# Patient Record
Sex: Female | Born: 1995 | Hispanic: No | Marital: Married | Smoking: Never smoker
Health system: Southern US, Community
[De-identification: ages and names within clinical notes are randomized; demographics above are authoritative.]

## PROBLEM LIST (undated history)

## (undated) ENCOUNTER — Inpatient Hospital Stay (HOSPITAL_COMMUNITY): Payer: Self-pay

## (undated) DIAGNOSIS — Z789 Other specified health status: Secondary | ICD-10-CM

## (undated) HISTORY — PX: NO PAST SURGERIES: SHX2092

---

## 2015-05-13 ENCOUNTER — Encounter (HOSPITAL_COMMUNITY): Payer: Self-pay | Admitting: *Deleted

## 2015-05-13 ENCOUNTER — Emergency Department (HOSPITAL_COMMUNITY)
Admission: EM | Admit: 2015-05-13 | Discharge: 2015-05-13 | Disposition: A | Discharge status: Home / Self Care | Payer: Medicaid Other | Source: Home / Self Care | Attending: Family Medicine | Admitting: Family Medicine

## 2015-05-13 DIAGNOSIS — L250 Unspecified contact dermatitis due to cosmetics: Secondary | ICD-10-CM | POA: Diagnosis not present

## 2015-05-13 MED ORDER — MOMETASONE FUROATE 0.1 % EX CREA: 1.0000 "application " | TOPICAL_CREAM | Freq: Every day | CUTANEOUS | Status: DC

## 2015-05-13 NOTE — ED Notes (Signed)
Pt  Has  A  Rash   With     Dry  Sky       Symptoms  X  5  Days     Pt    Reports     No       New  Medications         No known            Causative  Agents

## 2015-05-13 NOTE — ED Provider Notes (Addendum)
CSN: [id number]     Arrival date & time 05/13/15  1741 History   First MD Initiated Contact with Patient 05/13/15 1908     Chief Complaint  Patient presents with  . Rash   (Consider location/radiation/quality/duration/timing/severity/associated sxs/prior Treatment) Patient is a 20 y.o. female presenting with rash. The history is provided by the patient and the spouse.  Rash Location:  Face and head/neck Head/neck rash location:  R neck and L neck Facial rash location:  R cheek and L cheek Quality: itchiness and redness   Quality: not scaling and not swelling   Severity:  Mild Onset quality:  Gradual Duration:  5 days Progression:  Unchanged Chronicity:  New Relieved by:  None tried Worsened by:  Nothing tried Ineffective treatments:  None tried   History reviewed. No pertinent past medical history. History reviewed. No pertinent past surgical history. History reviewed. No pertinent family history. Social History  Substance Use Topics  . Smoking status: None  . Smokeless tobacco: None  . Alcohol Use: No   OB History    No data available     Review of Systems  Constitutional: Negative.   Skin: Positive for rash.  All other systems reviewed and are negative.   Allergies  Review of patient's allergies indicates not on file.  Home Medications   Prior to Admission medications   Medication Sig Start Date End Date Taking? Authorizing Provider  mometasone (ELOCON) 0.1 % cream Apply 1 application topically daily. At hs 05/13/15   Linna HoffJames D Kindl, MD   Meds Ordered and Administered this Visit  Medications - No data to display  BP 108/71 mmHg  Pulse 77  Temp(Src) 97.2 F (36.2 C) (Oral)  Resp 16  SpO2 97%  LMP 04/22/2015 No data found.   Physical Exam  Constitutional: She is oriented to person, place, and time. She appears well-developed and well-nourished. No distress.  HENT:  Mouth/Throat: Oropharynx is clear and moist.  Neck: Normal range of motion. Neck  supple.  Lymphadenopathy:    She has no cervical adenopathy.  Neurological: She is alert and oriented to person, place, and time.  Skin: Skin is warm and dry. Rash noted.  bilat alar pruritic erythema and ant bilat neck skin  Nursing note and vitals reviewed.   ED Course  Procedures (including critical care time)  Labs Review Labs Reviewed - No data to display  Imaging Review No results found.   Visual Acuity Review  Right Eye Distance:   Left Eye Distance:   Bilateral Distance:    Right Eye Near:   Left Eye Near:    Bilateral Near:         MDM   1. Contact dermatitis due to cosmetics    Meds ordered this encounter  Medications  . mometasone (ELOCON) 0.1 % cream    Sig: Apply 1 application topically daily. At hs    Dispense:  15 g    Refill:  1       Linna HoffJames D Kindl, MD 05/13/15 Ernestina [id number]  Linna HoffJames D Kindl, MD 05/13/15 Ernestina [id number]

## 2015-06-12 ENCOUNTER — Encounter (HOSPITAL_COMMUNITY): Payer: Self-pay | Admitting: Family Medicine

## 2015-06-12 ENCOUNTER — Emergency Department (HOSPITAL_COMMUNITY)
Admission: EM | Admit: 2015-06-12 | Discharge: 2015-06-12 | Disposition: A | Discharge status: Home / Self Care | Payer: Medicaid Other | Attending: Emergency Medicine | Admitting: Emergency Medicine

## 2015-06-12 DIAGNOSIS — Z3202 Encounter for pregnancy test, result negative: Secondary | ICD-10-CM | POA: Diagnosis not present

## 2015-06-12 DIAGNOSIS — R251 Tremor, unspecified: Secondary | ICD-10-CM | POA: Insufficient documentation

## 2015-06-12 DIAGNOSIS — B349 Viral infection, unspecified: Secondary | ICD-10-CM | POA: Insufficient documentation

## 2015-06-12 DIAGNOSIS — Z7952 Long term (current) use of systemic steroids: Secondary | ICD-10-CM | POA: Insufficient documentation

## 2015-06-12 DIAGNOSIS — R51 Headache: Secondary | ICD-10-CM | POA: Diagnosis present

## 2015-06-12 DIAGNOSIS — R52 Pain, unspecified: Secondary | ICD-10-CM

## 2015-06-12 LAB — URINE MICROSCOPIC-ADD ON

## 2015-06-12 LAB — URINALYSIS, ROUTINE W REFLEX MICROSCOPIC
BILIRUBIN URINE: NEGATIVE
GLUCOSE, UA: NEGATIVE mg/dL
Hgb urine dipstick: NEGATIVE
KETONES UR: NEGATIVE mg/dL
Nitrite: NEGATIVE
PH: 7 (ref 5.0–8.0)
Protein, ur: NEGATIVE mg/dL
Specific Gravity, Urine: 1.021 (ref 1.005–1.030)

## 2015-06-12 LAB — POC URINE PREG, ED: Preg Test, Ur: NEGATIVE

## 2015-06-12 MED ORDER — ACETAMINOPHEN 325 MG PO TABS: 650.0000 mg | ORAL_TABLET | Freq: Four times a day (QID) | ORAL | Status: DC | PRN

## 2015-06-12 MED ORDER — ACETAMINOPHEN 325 MG PO TABS
650.0000 mg | ORAL_TABLET | Freq: Once | ORAL | Status: AC
  Administered 2015-06-12: 650 mg via ORAL
  Filled 2015-06-12: qty 2

## 2015-06-12 MED ORDER — SODIUM CHLORIDE 0.9 % IV BOLUS (SEPSIS): 1000.0000 mL | Freq: Once | INTRAVENOUS | Status: DC

## 2015-06-12 MED ORDER — KETOROLAC TROMETHAMINE 30 MG/ML IJ SOLN
30.0000 mg | Freq: Once | INTRAMUSCULAR | Status: DC
  Filled 2015-06-12: qty 1

## 2015-06-12 NOTE — ED Notes (Signed)
Iv attempted x 2 without success. 

## 2015-06-12 NOTE — ED Notes (Signed)
Pt here for body aches, headache, and shaking. Denies N,V,D.

## 2015-06-12 NOTE — ED Provider Notes (Signed)
History  By signing my name below, I, Karle Plumber, attest that this documentation has been prepared under the direction and in the presence of Tyara Dassow, [place]. Electronically Signed: Karle Plumber, ED Scribe. 06/12/2015. 1:17 PM  Chief Complaint  Patient presents with  . Generalized Body Aches  . Shaking  . Headache   The history is provided by the patient and medical records. No language interpreter was used.    HPI Comments:  Elizabeth Summers is a 20 y.o. female who presents to the Emergency Department complaining of generalized body aches that began last night. She reports HA and states she feels "shaky inside her body". She has not taken anything for her symptoms. She denies modifying factors. She denies nausea, vomiting, abdominal pain, LOC, dizziness, light-headedness, fever, chills, cough, congestion.   History reviewed. No pertinent past medical history. History reviewed. No pertinent past surgical history. History reviewed. No pertinent family history. Social History  Substance Use Topics  . Smoking status: Never Smoker   . Smokeless tobacco: None  . Alcohol Use: No   OB History    No data available     Review of Systems A complete 10 system review of systems was obtained and all systems are negative except as noted in the HPI and PMH.   Allergies  Review of patient's allergies indicates no known allergies.  Home Medications   Prior to Admission medications   Medication Sig Start Date End Date Taking? Authorizing Provider  mometasone (ELOCON) 0.1 % cream Apply 1 application topically daily. At hs 05/13/15   Linna Hoff, MD   Triage Vitals: BP 112/62 mmHg  Pulse 76  Temp(Src) 97.5 F (36.4 C) (Oral)  Resp 14  SpO2 98%  LMP 04/22/2015 Physical Exam  Constitutional: She is oriented to person, place, and time. She appears well-developed and well-nourished.  HENT:  Head: Normocephalic and atraumatic.  Right Ear: External ear normal.  Left Ear:  External ear normal.  MM dry  Eyes: Conjunctivae and EOM are normal. Pupils are equal, round, and reactive to light.  Neck: Normal range of motion.  Cardiovascular: Normal rate, regular rhythm and normal heart sounds.  Exam reveals no gallop and no friction rub.   No murmur heard. Pulmonary/Chest: Effort normal and breath sounds normal. No respiratory distress. She has no wheezes. She has no rales.  Abdominal: There is no CVA tenderness.  Musculoskeletal: Normal range of motion.  No C, T or L spine tenderness.  Neurological: She is alert and oriented to person, place, and time. No cranial nerve deficit. Coordination normal.  Skin: Skin is warm and dry.  Psychiatric: She has a normal mood and affect. Her behavior is normal.  Nursing note and vitals reviewed.   ED Course  Procedures (including critical care time) DIAGNOSTIC STUDIES: Oxygen Saturation is 98% on RA, normal by my interpretation.   COORDINATION OF CARE: 12:33 PM- Informed pt of normal labs. Will order IV fluids, Toradol and Tylenol. Pt verbalizes understanding and agrees to plan.  Medications  acetaminophen (TYLENOL) tablet 650 mg (650 mg Oral Given 06/12/15 1354)    Labs Review Labs Reviewed  URINALYSIS, ROUTINE W REFLEX MICROSCOPIC (NOT AT [facility]) - Abnormal; Notable for the following:    APPearance HAZY (*)    Leukocytes, UA MODERATE (*)    All other components within normal limits  URINE MICROSCOPIC-ADD ON - Abnormal; Notable for the following:    Squamous Epithelial / LPF 6-30 (*)    Bacteria, UA FEW (*)  All other components within normal limits  POC URINE PREG, ED    Imaging Review No results found. I have personally reviewed and evaluated these images and lab results as part of my medical decision-making.   EKG Interpretation None      MDM   Final diagnoses:  Viral syndrome  Body aches    Suspect viral syndrome with diffuse body aches and dull headache. Neuro exam intact. Rest of exam  nonfocal. Hemodynamically stable. Pt declines fluids and toradol after unsuccessful IV attempts. Tylenol given in the ED with rx for same. Resource guide given to establish PCP for f/u. ER return precautions given.   I personally performed the services described in this documentation, which was scribed in my presence. The recorded information has been reviewed and is accurate.    Carlene CoriaSerena Y Draycen Leichter, PA-C 06/12/15 1405  Lavera Guiseana Duo Liu, MD 06/12/15 [phone]

## 2015-06-12 NOTE — ED Notes (Signed)
Pt verbalizes understanding of instructions. 

## 2015-06-12 NOTE — Discharge Instructions (Signed)
Take medications as prescribed. Return to the emergency room for worsening condition or new concerning symptoms. Follow up with your regular doctor. If you don't have a regular doctor use one of the numbers below to establish a primary care doctor. ° ° °Emergency Department Resource Guide °1) Find a Doctor and Pay Out of Pocket °Although you won't have to find out who is covered by your insurance plan, it is a good idea to ask around and get recommendations. You will then need to call the office and see if the doctor you have chosen will accept you as a new patient and what types of options they offer for patients who are self-pay. Some doctors offer discounts or will set up payment plans for their patients who do not have insurance, but you will need to ask so you aren't surprised when you get to your appointment. ° °2) Contact Your Local Health Department °Not all health departments have doctors that can see patients for sick visits, but many do, so it is worth a call to see if yours does. If you don't know where your local health department is, you can check in your phone book. The CDC also has a tool to help you locate your state's health department, and many state websites also have listings of all of their local health departments. ° °3) Find a Walk-in Clinic °If your illness is not likely to be very severe or complicated, you may want to try a walk in clinic. These are popping up all over the country in pharmacies, drugstores, and shopping centers. They're usually staffed by nurse practitioners or physician assistants that have been trained to treat common illnesses and complaints. They're usually fairly quick and inexpensive. However, if you have serious medical issues or chronic medical problems, these are probably not your best option. ° °No Primary Care Doctor: °- Call [facility] at  832-8000 - they can help you locate a primary care doctor that  accepts your insurance, provides certain services,  etc. °- Physician Referral Service- 1-800-533-3463 ° °Emergency Department Resource Guide °1) Find a Doctor and Pay Out of Pocket °Although you won't have to find out who is covered by your insurance plan, it is a good idea to ask around and get recommendations. You will then need to call the office and see if the doctor you have chosen will accept you as a new patient and what types of options they offer for patients who are self-pay. Some doctors offer discounts or will set up payment plans for their patients who do not have insurance, but you will need to ask so you aren't surprised when you get to your appointment. ° °2) Contact Your Local Health Department °Not all health departments have doctors that can see patients for sick visits, but many do, so it is worth a call to see if yours does. If you don't know where your local health department is, you can check in your phone book. The CDC also has a tool to help you locate your state's health department, and many state websites also have listings of all of their local health departments. ° °3) Find a Walk-in Clinic °If your illness is not likely to be very severe or complicated, you may want to try a walk in clinic. These are popping up all over the country in pharmacies, drugstores, and shopping centers. They're usually staffed by nurse practitioners or physician assistants that have been trained to treat common illnesses and complaints. They're usually fairly   quick and inexpensive. However, if you have serious medical issues or chronic medical problems, these are probably not your best option. ° °No Primary Care Doctor: °- Call [facility] Connect at  832-8000 - they can help you locate a primary care doctor that  accepts your insurance, provides certain services, etc. °- Physician Referral Service- 1-800-533-3463 ° °Chronic Pain Problems: °Organization         Address  Phone   Notes  ° [facility]  ([phone]) [phone] Patients need to be referred by  their primary care doctor.  ° °Medication Assistance: °Organization         Address  Phone   Notes  °[place] [facility] [address] °[place], [place] [address] [phone]) [phone] --Must be a resident of [place] °-- Must have NO insurance coverage whatsoever (no Medicaid/ Medicare, etc.) °-- The pt. MUST have a primary care doctor that directs their care regularly and follows them in the community °  °[facility]  (866) 331-1348   °[employer]  (888) 892-1162   ° °Agencies that provide inexpensive medical care: °Organization         Address  Phone   Notes  °[facility]  ([phone]) [phone]   °[facility]    (336) 832-7272   °[facility] [address] °[place], [place] [address] (336) [phone]   °[facility] [address], °[place] ([phone]) [phone]   °[facility]    (336) 373-0678   °[facility]    (336) 272-1050   °[facility] ° [address], [place] Phone:  (336) [phone], Fax:  (336) [phone] Hours of Operation:  9 am - 6 pm, M-F.  Also accepts Medicaid/Medicare and self-pay.  °[facility] ° [address], [place] Phone: (336) [phone], Fax: (336) [phone]. Hours of Operation:  8:30 am - 5:30 pm, M-F.  Also accepts Medicaid and self-pay.  °[facility] [place] [address], [place] Phone: (336) [phone]   °[facility] [address], [place], [place] (336)[phone], Ext. 123 Mondays & Thursdays: 7-9 AM.  First 15 patients are seen on a first come, first serve basis. °  ° °Medicaid-accepting [place] Providers: ° °Organization         Address  Phone   Notes  °[facility] [address], [place] ([phone]) [phone] Also accepts self-pay patients.  °[facility] [address], [place] ° ([phone]) [phone]   °[facility]  [address], [place] ([phone]) [phone]   °[facility] [address] [place] [address], [place] ([phone]) [phone]   °[facility] [address], [place]  ° ([phone]) [phone] Only accepts [place] Access Medicaid patients after they have their name applied to their card.  ° °Self-Pay (no insurance) in [place]: ° °Organization         Address  Phone   Notes  °Sickle Cell Patients, [facility] [address], [place] ([phone]) [phone]   °[facility] [address], [place] ([phone]) [phone]   °[facility] ° [address], [place] ([phone]) [phone]   °[facility]/Dr. Osei-Bonsu ° [address] [place] [address], [place] or [address], [place] [phone]) [phone] Phone number   for both [place] and [place] locations is the same.  °[facility] [address], [place] ([phone]) [phone]   °[facility] [address] [place] [address], [place] or [address] ([phone]) [phone] °(336) [phone]   °[facility] [address], [place] (336) [phone], phone; ([phone]) [phone], fax Sees patients 1st and 3rd Saturday of every month.  Must not qualify for public or private insurance (i.e. Medicaid, Medicare, [place] Health Choice, Veterans' Benefits) • Household income should be no more than 200% of the poverty level •The clinic cannot treat you if you are pregnant or think you are pregnant • Sexually transmitted diseases are not treated at the clinic.  ° ° ° °

## 2016-07-31 ENCOUNTER — Ambulatory Visit (HOSPITAL_COMMUNITY)
Admission: EM | Admit: 2016-07-31 | Discharge: 2016-07-31 | Disposition: A | Discharge status: Home / Self Care | Payer: Medicaid Other | Attending: Family Medicine | Admitting: Family Medicine

## 2016-07-31 ENCOUNTER — Encounter (HOSPITAL_COMMUNITY): Payer: Self-pay | Admitting: Emergency Medicine

## 2016-07-31 DIAGNOSIS — K0889 Other specified disorders of teeth and supporting structures: Secondary | ICD-10-CM

## 2016-07-31 MED ORDER — HYDROCODONE-ACETAMINOPHEN 5-325 MG PO TABS: 1.0000 | ORAL_TABLET | Freq: Four times a day (QID) | ORAL | 0 refills | Status: DC | PRN

## 2016-07-31 MED ORDER — PENICILLIN V POTASSIUM 500 MG PO TABS: 500.0000 mg | ORAL_TABLET | Freq: Three times a day (TID) | ORAL | 0 refills | Status: DC

## 2016-07-31 NOTE — Discharge Instructions (Signed)
You will need to get dental care.  Try one of the phone numbers we give you on Monday when the offices are open.

## 2016-07-31 NOTE — ED Provider Notes (Signed)
[facility]    CSN: [id number] Arrival date & time: 07/31/16  1742     History   Chief Complaint Chief Complaint  Patient presents with  . Dental Pain    HPI Elizabeth Summers is a 21 y.o. female.   This is a 21 year old woman with dental pain for a week on the lower right side of the jaw.  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      History reviewed. No pertinent past medical history.  There are no active problems to display for this patient.   History reviewed. No pertinent surgical history.  OB History    No data available       Home Medications    Prior to Admission medications   Medication Sig Start Date End Date Taking? Authorizing Provider  acetaminophen (TYLENOL) 325 MG tablet Take 2 tablets (650 mg total) by mouth every 6 (six) hours as needed. 06/12/15   Sam, Ace GinsSerena Y, PA-C  HYDROcodone-acetaminophen (NORCO) 5-325 MG tablet Take 1 tablet by mouth every 6 (six) hours as needed for moderate pain. 07/31/16   Elvina SidleLauenstein, Kalenna Millett, MD  mometasone (ELOCON) 0.1 % cream Apply 1 application topically daily. At hs 05/13/15   Linna HoffKindl, James D, MD  penicillin v potassium (VEETID) 500 MG tablet Take 1 tablet (500 mg total) by mouth 3 (three) times daily. 07/31/16   Elvina SidleLauenstein, Maddyn Lieurance, MD    Family History History reviewed. No pertinent family history.  Social History Social History  Substance Use Topics  . Smoking status: Never Smoker  . Smokeless tobacco: Never Used  . Alcohol use No     Allergies   Patient has no known allergies.   Review of Systems Review of Systems  HENT: Positive for dental problem.   All other systems reviewed and are negative.    Physical Exam Triage Vital Signs ED Triage Vitals  Enc Vitals Group     BP      Pulse      Resp      Temp      Temp src      SpO2      Weight      Height      Head Circumference      Peak Flow      Pain Score      Pain Loc      Pain Edu?      Excl. in GC?    No  data found.   Updated Vital Signs BP 101/63 (BP Location: Right Arm)   Pulse 66   Temp 98.2 F (36.8 C) (Oral)   Resp 18   LMP 07/27/2016   SpO2 100%    Physical Exam  Constitutional: She is oriented to person, place, and time. She appears well-developed and well-nourished.  HENT:  Right Ear: External ear normal.  Left Ear: External ear normal.  Tooth #32 is erupting with some gum swelling  Eyes: Conjunctivae are normal.  Neck: Normal range of motion. Neck supple.  Pulmonary/Chest: Effort normal.  Musculoskeletal: Normal range of motion.  Neurological: She is alert and oriented to person, place, and time.  Skin: Skin is warm and dry.  Nursing note and vitals reviewed.    UC Treatments / Results  Labs (all labs ordered are listed, but only abnormal results are displayed) Labs Reviewed - No data to display  EKG  EKG Interpretation None       Radiology No results found.  Procedures  Procedures (including critical care time)  Medications Ordered in UC Medications - No data to display   Initial Impression / Assessment and Plan / UC Course  I have reviewed the triage vital signs and the nursing notes.  Pertinent labs & imaging results that were available during my care of the patient were reviewed by me and considered in my medical decision making (see chart for details).     Final Clinical Impressions(s) / UC Diagnoses   Final diagnoses:  Pain, dental    New Prescriptions New Prescriptions   HYDROCODONE-ACETAMINOPHEN (NORCO) 5-325 MG TABLET    Take 1 tablet by mouth every 6 (six) hours as needed for moderate pain.   PENICILLIN V POTASSIUM (VEETID) 500 MG TABLET    Take 1 tablet (500 mg total) by mouth 3 (three) times daily.     Elvina Sidle, MD 07/31/16 [phone]

## 2016-07-31 NOTE — ED Triage Notes (Signed)
Pt c/o right lower dental pain onset 1 week  Denies fevers, chills  A&O x4... NAD.... Ambulatory

## 2017-02-17 LAB — OB RESULTS CONSOLE GBS: GBS: NEGATIVE

## 2017-02-17 LAB — OB RESULTS CONSOLE GC/CHLAMYDIA
Chlamydia: NEGATIVE
Gonorrhea: NEGATIVE

## 2017-09-16 ENCOUNTER — Other Ambulatory Visit (HOSPITAL_COMMUNITY): Payer: Self-pay | Admitting: Nurse Practitioner

## 2017-09-16 DIAGNOSIS — Z3682 Encounter for antenatal screening for nuchal translucency: Secondary | ICD-10-CM

## 2017-09-16 DIAGNOSIS — Z3A13 13 weeks gestation of pregnancy: Secondary | ICD-10-CM

## 2017-09-16 LAB — OB RESULTS CONSOLE ABO/RH: RH Type: POSITIVE

## 2017-09-16 LAB — OB RESULTS CONSOLE RPR: RPR: NONREACTIVE

## 2017-09-16 LAB — OB RESULTS CONSOLE ANTIBODY SCREEN: Antibody Screen: NEGATIVE

## 2017-09-16 LAB — OB RESULTS CONSOLE HEPATITIS B SURFACE ANTIGEN: Hepatitis B Surface Ag: NEGATIVE

## 2017-09-16 LAB — OB RESULTS CONSOLE RUBELLA ANTIBODY, IGM: Rubella: IMMUNE

## 2017-09-16 LAB — OB RESULTS CONSOLE HIV ANTIBODY (ROUTINE TESTING): HIV: NONREACTIVE

## 2017-09-16 LAB — OB RESULTS CONSOLE GC/CHLAMYDIA
Chlamydia: NEGATIVE
Gonorrhea: NEGATIVE

## 2017-09-20 ENCOUNTER — Encounter (HOSPITAL_COMMUNITY): Payer: Self-pay | Admitting: *Deleted

## 2017-09-22 ENCOUNTER — Ambulatory Visit (HOSPITAL_COMMUNITY)
Admission: RE | Admit: 2017-09-22 | Discharge: 2017-09-22 | Disposition: A | Discharge status: Home / Self Care | Payer: Medicaid Other | Source: Ambulatory Visit | Attending: Nurse Practitioner | Admitting: Nurse Practitioner

## 2017-09-22 ENCOUNTER — Ambulatory Visit (HOSPITAL_COMMUNITY): Admission: RE | Admit: 2017-09-22 | Payer: Medicaid Other | Source: Ambulatory Visit

## 2017-09-22 ENCOUNTER — Other Ambulatory Visit (HOSPITAL_COMMUNITY): Payer: Self-pay | Admitting: *Deleted

## 2017-09-22 ENCOUNTER — Other Ambulatory Visit (HOSPITAL_COMMUNITY): Payer: Self-pay | Admitting: Nurse Practitioner

## 2017-09-22 ENCOUNTER — Encounter (HOSPITAL_COMMUNITY): Payer: Self-pay

## 2017-09-22 DIAGNOSIS — Z3A15 15 weeks gestation of pregnancy: Secondary | ICD-10-CM

## 2017-09-22 DIAGNOSIS — Z363 Encounter for antenatal screening for malformations: Secondary | ICD-10-CM

## 2017-09-22 DIAGNOSIS — Z3A13 13 weeks gestation of pregnancy: Secondary | ICD-10-CM

## 2017-09-22 DIAGNOSIS — Z3682 Encounter for antenatal screening for nuchal translucency: Secondary | ICD-10-CM

## 2017-09-22 DIAGNOSIS — Z362 Encounter for other antenatal screening follow-up: Secondary | ICD-10-CM

## 2017-09-22 HISTORY — DX: Other specified health status: Z78.9

## 2017-10-01 ENCOUNTER — Inpatient Hospital Stay (HOSPITAL_COMMUNITY)
Admission: AD | Admit: 2017-10-01 | Discharge: 2017-10-01 | Disposition: A | Discharge status: Home / Self Care | Payer: Medicaid Other | Source: Ambulatory Visit | Attending: Obstetrics and Gynecology | Admitting: Obstetrics and Gynecology

## 2017-10-01 ENCOUNTER — Encounter (HOSPITAL_COMMUNITY): Payer: Self-pay

## 2017-10-01 DIAGNOSIS — O9989 Other specified diseases and conditions complicating pregnancy, childbirth and the puerperium: Secondary | ICD-10-CM | POA: Diagnosis not present

## 2017-10-01 DIAGNOSIS — Z3A16 16 weeks gestation of pregnancy: Secondary | ICD-10-CM | POA: Diagnosis not present

## 2017-10-01 DIAGNOSIS — M545 Low back pain: Secondary | ICD-10-CM | POA: Diagnosis not present

## 2017-10-01 DIAGNOSIS — O26892 Other specified pregnancy related conditions, second trimester: Secondary | ICD-10-CM | POA: Diagnosis present

## 2017-10-01 DIAGNOSIS — Z79899 Other long term (current) drug therapy: Secondary | ICD-10-CM | POA: Diagnosis not present

## 2017-10-01 DIAGNOSIS — M549 Dorsalgia, unspecified: Secondary | ICD-10-CM | POA: Diagnosis not present

## 2017-10-01 DIAGNOSIS — O99891 Other specified diseases and conditions complicating pregnancy: Secondary | ICD-10-CM

## 2017-10-01 LAB — URINALYSIS, ROUTINE W REFLEX MICROSCOPIC
BILIRUBIN URINE: NEGATIVE
Bacteria, UA: NONE SEEN
GLUCOSE, UA: NEGATIVE mg/dL
Hgb urine dipstick: NEGATIVE
KETONES UR: NEGATIVE mg/dL
NITRITE: NEGATIVE
PH: 7 (ref 5.0–8.0)
Protein, ur: NEGATIVE mg/dL
Specific Gravity, Urine: 1.008 (ref 1.005–1.030)

## 2017-10-01 MED ORDER — CYCLOBENZAPRINE HCL 10 MG PO TABS
10.0000 mg | ORAL_TABLET | Freq: Once | ORAL | Status: AC
  Administered 2017-10-01: 10 mg via ORAL
  Filled 2017-10-01: qty 1

## 2017-10-01 NOTE — Discharge Instructions (Signed)

## 2017-10-01 NOTE — MAU Provider Note (Signed)
History     CSN: [id number]  Arrival date and time: 10/01/17 1629  Provider first contact with patient at 1725     Chief Complaint  Patient presents with  . Back Pain   HPI  Ms.  Elizabeth Summers is a 22 y.o. year old [id number] female at 1318w4d weeks gestation who presents to MAU reporting lower back pain since yesterday. She denies any heavy lifting or strenuous activity over the past 24-48 hrs. She has not taken anything for the pain. She denies abdominal/pelvic pain/cramping, VB or LOF.  Past Medical History:  Diagnosis Date  . Medical history non-contributory     Past Surgical History:  Procedure Laterality Date  . NO PAST SURGERIES      No family history on file.  Social History   Tobacco Use  . Smoking status: Never Smoker  . Smokeless tobacco: Never Used  Substance Use Topics  . Alcohol use: No  . Drug use: No    Allergies: No Known Allergies  Medications Prior to Admission  Medication Sig Dispense Refill Last Dose  . acetaminophen (TYLENOL) 325 MG tablet Take 2 tablets (650 mg total) by mouth every 6 (six) hours as needed. (Patient not taking: Reported on 09/22/2017) 30 tablet 0 Not Taking  . HYDROcodone-acetaminophen (NORCO) 5-325 MG tablet Take 1 tablet by mouth every 6 (six) hours as needed for moderate pain. (Patient not taking: Reported on 09/22/2017) 12 tablet 0 Not Taking  . mometasone (ELOCON) 0.1 % cream Apply 1 application topically daily. At hs (Patient not taking: Reported on 09/22/2017) 15 g 1 Not Taking  . penicillin v potassium (VEETID) 500 MG tablet Take 1 tablet (500 mg total) by mouth 3 (three) times daily. (Patient not taking: Reported on 09/22/2017) 30 tablet 0 Not Taking  . Prenatal Vit-Fe Fumarate-FA (PRENATAL VITAMIN PO) Take by mouth.   Taking    Review of Systems  Constitutional: Negative.   HENT: Negative.   Eyes: Negative.   Respiratory: Negative.   Cardiovascular: Negative.   Gastrointestinal: Negative.   Endocrine: Negative.    Genitourinary: Negative.   Musculoskeletal: Positive for back pain (lower).  Skin: Negative.   Allergic/Immunologic: Negative.   Neurological: Negative.   Hematological: Negative.   Psychiatric/Behavioral: Negative.    Physical Exam   Blood pressure (!) 101/58, pulse 89, temperature 97.6 F (36.4 C), temperature source Oral, resp. rate 18, weight 60.3 kg, last menstrual period 06/18/2017.  Physical Exam  Nursing note and vitals reviewed. Constitutional: She is oriented to person, place, and time. She appears well-developed and well-nourished.  HENT:  Head: Normocephalic and atraumatic.  Eyes: Pupils are equal, round, and reactive to light.  Neck: Normal range of motion.  Cardiovascular: Normal rate.  Respiratory: Effort normal.  GI: Soft.  Genitourinary:  Genitourinary Comments: Pelvic deferred  Musculoskeletal: Normal range of motion.  Neurological: She is alert and oriented to person, place, and time.  Skin: Skin is warm and dry.  Psychiatric: She has a normal mood and affect. Her behavior is normal. Judgment and thought content normal.    MAU Course  Procedures  MDM CCUA Flexeril 10 mg po -- pain resolved "want to go home"  Results for orders placed or performed during the hospital encounter of 10/01/17 (from the past 24 hour(s))  Urinalysis, Routine w reflex microscopic     Status: Abnormal   Collection Time: 10/01/17  5:03 PM  Result Value Ref Range   Color, Urine YELLOW YELLOW   APPearance HAZY (A) CLEAR  Specific Gravity, Urine 1.008 1.005 - 1.030   pH 7.0 5.0 - 8.0   Glucose, UA NEGATIVE NEGATIVE mg/dL   Hgb urine dipstick NEGATIVE NEGATIVE   Bilirubin Urine NEGATIVE NEGATIVE   Ketones, ur NEGATIVE NEGATIVE mg/dL   Protein, ur NEGATIVE NEGATIVE mg/dL   Nitrite NEGATIVE NEGATIVE   Leukocytes, UA TRACE (A) NEGATIVE   RBC / HPF 0-5 0 - 5 RBC/hpf   WBC, UA 0-5 0 - 5 WBC/hpf   Bacteria, UA NONE SEEN NONE SEEN   Squamous Epithelial / LPF 6-10 0 - 5     Assessment and Plan  Back pain affecting pregnancy in second trimester - Plan: Discharge patient - Ok to take Tylenol 1000 mg every 6 hrs prn pain - Safe meds in pregnancy list given - Discharge home - Keep scheduled appt at [facility] - Patient verbalized an understanding of the plan of care and agrees.   Raelyn Mora, MSN, CNM 10/01/2017, 5:27 PM

## 2017-10-01 NOTE — MAU Note (Signed)
Pt C/O back pain since yesterday, denies bleeding, [facility].

## 2017-10-27 ENCOUNTER — Inpatient Hospital Stay (HOSPITAL_COMMUNITY)
Admission: AD | Admit: 2017-10-27 | Discharge: 2017-10-27 | Disposition: A | Discharge status: Home / Self Care | Payer: Medicaid Other | Source: Ambulatory Visit | Attending: Obstetrics & Gynecology | Admitting: Obstetrics & Gynecology

## 2017-10-27 ENCOUNTER — Other Ambulatory Visit: Payer: Self-pay

## 2017-10-27 ENCOUNTER — Encounter (HOSPITAL_COMMUNITY): Payer: Self-pay

## 2017-10-27 DIAGNOSIS — R102 Pelvic and perineal pain: Secondary | ICD-10-CM

## 2017-10-27 DIAGNOSIS — R1030 Lower abdominal pain, unspecified: Secondary | ICD-10-CM | POA: Diagnosis present

## 2017-10-27 DIAGNOSIS — Z3A20 20 weeks gestation of pregnancy: Secondary | ICD-10-CM | POA: Diagnosis not present

## 2017-10-27 DIAGNOSIS — O99612 Diseases of the digestive system complicating pregnancy, second trimester: Secondary | ICD-10-CM | POA: Insufficient documentation

## 2017-10-27 DIAGNOSIS — O26892 Other specified pregnancy related conditions, second trimester: Secondary | ICD-10-CM | POA: Diagnosis present

## 2017-10-27 DIAGNOSIS — O26899 Other specified pregnancy related conditions, unspecified trimester: Secondary | ICD-10-CM

## 2017-10-27 DIAGNOSIS — K219 Gastro-esophageal reflux disease without esophagitis: Secondary | ICD-10-CM | POA: Diagnosis not present

## 2017-10-27 LAB — CBC
HCT: 30.2 % — ABNORMAL LOW (ref 36.0–46.0)
HEMOGLOBIN: 10 g/dL — AB (ref 12.0–15.0)
MCH: 26.7 pg (ref 26.0–34.0)
MCHC: 33.1 g/dL (ref 30.0–36.0)
MCV: 80.7 fL (ref 78.0–100.0)
Platelets: 241 10*3/uL (ref 150–400)
RBC: 3.74 MIL/uL — AB (ref 3.87–5.11)
RDW: 13.5 % (ref 11.5–15.5)
WBC: 8.7 10*3/uL (ref 4.0–10.5)

## 2017-10-27 LAB — LIPASE, BLOOD: LIPASE: 37 U/L (ref 11–51)

## 2017-10-27 LAB — COMPREHENSIVE METABOLIC PANEL
ALK PHOS: 66 U/L (ref 38–126)
ALT: 15 U/L (ref 0–44)
ANION GAP: 9 (ref 5–15)
AST: 20 U/L (ref 15–41)
Albumin: 3.2 g/dL — ABNORMAL LOW (ref 3.5–5.0)
BUN: 6 mg/dL (ref 6–20)
CALCIUM: 8.9 mg/dL (ref 8.9–10.3)
CO2: 21 mmol/L — ABNORMAL LOW (ref 22–32)
Chloride: 106 mmol/L (ref 98–111)
Creatinine, Ser: 0.55 mg/dL (ref 0.44–1.00)
GFR calc non Af Amer: >60 mL/min (ref >60)
GLUCOSE: 93 mg/dL (ref 70–99)
POTASSIUM: 3.5 mmol/L (ref 3.5–5.1)
Sodium: 136 mmol/L (ref 135–145)
TOTAL PROTEIN: 6.8 g/dL (ref 6.5–8.1)

## 2017-10-27 LAB — URINALYSIS, ROUTINE W REFLEX MICROSCOPIC
BILIRUBIN URINE: NEGATIVE
GLUCOSE, UA: NEGATIVE mg/dL
HGB URINE DIPSTICK: NEGATIVE
Ketones, ur: NEGATIVE mg/dL
Nitrite: NEGATIVE
PH: 7 (ref 5.0–8.0)
Protein, ur: NEGATIVE mg/dL
Specific Gravity, Urine: 1.019 (ref 1.005–1.030)

## 2017-10-27 LAB — WET PREP, GENITAL
Clue Cells Wet Prep HPF POC: NONE SEEN
Sperm: NONE SEEN
Trich, Wet Prep: NONE SEEN
Yeast Wet Prep HPF POC: NONE SEEN

## 2017-10-27 MED ORDER — GI COCKTAIL ~~LOC~~
30.0000 mL | Freq: Once | ORAL | Status: AC
  Administered 2017-10-27: 30 mL via ORAL
  Filled 2017-10-27: qty 30

## 2017-10-27 MED ORDER — RANITIDINE HCL 150 MG PO CAPS: 150.0000 mg | ORAL_CAPSULE | Freq: Every evening | ORAL | 1 refills | Status: DC

## 2017-10-27 NOTE — MAU Provider Note (Addendum)
History     CSN: [id number]  Arrival date and time: 10/27/17 1525   First Provider Initiated Contact with Patient 10/27/17 1619      Chief Complaint  Patient presents with  . Abdominal Summers   Elizabeth Summers. Summers started 2 days ago. Summers is located bilateral upper and lower quadrants. Upper abdominal Summers is intermittent. Tolerating po. Reports daily burning in her upper chest. No alleviating or agrevating factors. Lower abd Summers only occurs with standing and walking. No GI or GU sx. No VB or discharge. No fevers. She has not tried anything for the Summers. [facility] and reports uncomplicated pregnancy.   OB History    Gravida  3   Para  1   Term  1   Preterm      AB  1   Living  1     SAB  1   TAB      Ectopic      Multiple      Live Births              Past Medical History:  Diagnosis Date  . Medical history non-contributory     Past Surgical History:  Procedure Laterality Date  . NO PAST SURGERIES      No family history on file.  Social History   Tobacco Use  . Smoking status: Never Smoker  . Smokeless tobacco: Never Used  Substance Use Topics  . Alcohol use: No  . Drug use: No    Allergies: No Known Allergies  Medications Prior to Admission  Medication Sig Dispense Refill Last Dose  . acetaminophen (TYLENOL) 325 MG tablet Take 2 tablets (650 mg total) by mouth every 6 (six) hours as needed. (Patient not taking: Reported on 09/22/2017) 30 tablet 0 Not Taking  . mometasone (ELOCON) 0.1 % cream Apply 1 application topically daily. At hs (Patient not taking: Reported on 09/22/2017) 15 g 1 Not Taking  . penicillin v potassium (VEETID) 500 MG tablet Take 1 tablet (500 mg total) by mouth 3 (three) times daily. (Patient not taking: Reported on 09/22/2017) 30 tablet 0 Not Taking  . Prenatal Vit-Fe Fumarate-FA (PRENATAL VITAMIN PO) Take by mouth.   Taking    Review of Systems  Constitutional: Negative for  chills and fever.  Gastrointestinal: Positive for abdominal Summers. Negative for constipation, diarrhea, nausea and vomiting.  Genitourinary: Negative for dysuria, frequency, urgency, vaginal bleeding and vaginal discharge.   Physical Exam   Blood pressure 102/61, pulse 81, temperature 98.2 F (36.8 C), temperature source Oral, resp. rate 16, height 5\' 1"  (1.549 m), weight 61.7 kg, last menstrual period 06/18/2017, SpO2 100 %.  Physical Exam  Nursing note and vitals reviewed. Constitutional: She is oriented to person, place, and time. She appears well-developed and well-nourished. No distress.  HENT:  Head: Normocephalic and atraumatic.  Neck: Normal range of motion.  Cardiovascular: Normal rate.  Respiratory: Effort normal. No respiratory distress.  GI: Soft. She exhibits no distension and no mass. There is tenderness in the right upper quadrant, suprapubic area and left upper quadrant. There is no rebound and no guarding.  gravid  Genitourinary:  Genitourinary Comments: External: no lesions or erythema Vagina: rugated, pink, moist, scant mucous yellow discharge Cervix closed/long   Musculoskeletal: Normal range of motion.  Neurological: She is alert and oriented to person, place, and time.  Skin: Skin is warm and dry.  Psychiatric: She has a normal mood and  affect.  FHT 156  Results for orders placed or performed during the hospital encounter of 10/27/17 (from the past 24 hour(s))  Wet prep, genital     Status: Abnormal   Collection Time: 10/27/17  4:37 PM  Result Value Ref Range   Yeast Wet Prep HPF POC NONE SEEN NONE SEEN   Trich, Wet Prep NONE SEEN NONE SEEN   Clue Cells Wet Prep HPF POC NONE SEEN NONE SEEN   WBC, Wet Prep HPF POC MANY (A) NONE SEEN   Sperm NONE SEEN   Urinalysis, Routine w reflex microscopic     Status: Abnormal   Collection Time: 10/27/17  4:52 PM  Result Value Ref Range   Color, Urine YELLOW YELLOW   APPearance HAZY (A) CLEAR   Specific Gravity,  Urine 1.019 1.005 - 1.030   pH 7.0 5.0 - 8.0   Glucose, UA NEGATIVE NEGATIVE mg/dL   Hgb urine dipstick NEGATIVE NEGATIVE   Bilirubin Urine NEGATIVE NEGATIVE   Ketones, ur NEGATIVE NEGATIVE mg/dL   Protein, ur NEGATIVE NEGATIVE mg/dL   Nitrite NEGATIVE NEGATIVE   Leukocytes, UA TRACE (A) NEGATIVE   RBC / HPF 0-5 0 - 5 RBC/hpf   WBC, UA 6-10 0 - 5 WBC/hpf   Bacteria, UA RARE (A) NONE SEEN   Squamous Epithelial / LPF 11-20 0 - 5   Mucus PRESENT   CBC     Status: Abnormal   Collection Time: 10/27/17  4:54 PM  Result Value Ref Range   WBC 8.7 4.0 - 10.5 K/uL   RBC 3.74 (L) 3.87 - 5.11 MIL/uL   Hemoglobin 10.0 (L) 12.0 - 15.0 g/dL   HCT 16.1 (L) 09.6 - 04.5 %   MCV 80.7 78.0 - 100.0 fL   MCH 26.7 26.0 - 34.0 pg   MCHC 33.1 30.0 - 36.0 g/dL   RDW 40.9 81.1 - 91.4 %   Platelets 241 150 - 400 K/uL  Comprehensive metabolic panel     Status: Abnormal   Collection Time: 10/27/17  4:54 PM  Result Value Ref Range   Sodium 136 135 - 145 mmol/L   Potassium 3.5 3.5 - 5.1 mmol/L   Chloride 106 98 - 111 mmol/L   CO2 21 (L) 22 - 32 mmol/L   Glucose, Bld 93 70 - 99 mg/dL   BUN 6 6 - 20 mg/dL   Creatinine, Ser 7.82 0.44 - 1.00 mg/dL   Calcium 8.9 8.9 - 95.6 mg/dL   Total Protein 6.8 6.5 - 8.1 g/dL   Albumin 3.2 (L) 3.5 - 5.0 g/dL   AST 20 15 - 41 U/L   ALT 15 0 - 44 U/L   Alkaline Phosphatase 66 38 - 126 U/L   Total Bilirubin <0.1 (L) 0.3 - 1.2 mg/dL   GFR calc non Af Amer >60 >60 mL/min   GFR calc Af Amer >60 >60 mL/min   Anion gap 9 5 - 15  Lipase, blood     Status: None   Collection Time: 10/27/17  4:54 PM  Result Value Ref Range   Lipase 37 11 - 51 U/L   MAU Course  Procedures GI cocktail  MDM Labs ordered and reviewed. Upper abd Summers is improved, no longer having LAP. No evidence of UTI or PTL. Summers likely GERD and RL. Discussed management. Stable for discharge home.  Assessment and Plan   1. [redacted] weeks gestation of pregnancy   2. Gastroesophageal reflux disease without  esophagitis   3. Summers of round ligament  during pregnancy    Discharge home Follow up at [facility] as scheduled PTL precautions Rx Zantac  Allergies as of 10/27/2017   No Known Allergies     Medication List    STOP taking these medications   mometasone 0.1 % cream Commonly known as:  ELOCON   penicillin v potassium 500 MG tablet Commonly known as:  VEETID     TAKE these medications   acetaminophen 325 MG tablet Commonly known as:  TYLENOL Take 2 tablets (650 mg total) by mouth every 6 (six) hours as needed.   PRENATAL VITAMIN PO Take by mouth.   ranitidine 150 MG capsule Commonly known as:  ZANTAC Take 1 capsule (150 mg total) by mouth every evening.      Donette Larry, CNM 10/27/2017, 6:04 PM

## 2017-10-27 NOTE — MAU Note (Signed)
Pt presents to MAU with c/o lower abdominal pain that started 2 days ago but is worse today. Pt denies VB and LOF.

## 2017-10-27 NOTE — Discharge Instructions (Signed)
Food Choices for Gastroesophageal Reflux Disease, Adult When you have gastroesophageal reflux disease (GERD), the foods you eat and your eating habits are very important. Choosing the right foods can help ease your discomfort. What guidelines do I need to follow?  Choose fruits, vegetables, whole grains, and low-fat dairy products.  Choose low-fat meat, fish, and poultry.  Limit fats such as oils, salad dressings, butter, nuts, and avocado.  Keep a food diary. This helps you identify foods that cause symptoms.  Avoid foods that cause symptoms. These may be different for everyone.  Eat small meals often instead of 3 large meals a day.  Eat your meals slowly, in a place where you are relaxed.  Limit fried foods.  Cook foods using methods other than frying.  Avoid drinking alcohol.  Avoid drinking large amounts of liquids with your meals.  Avoid bending over or lying down until 2-3 hours after eating. What foods are not recommended? These are some foods and drinks that may make your symptoms worse: Vegetables Tomatoes. Tomato juice. Tomato and spaghetti sauce. Chili peppers. Onion and garlic. Horseradish. Fruits Oranges, grapefruit, and lemon (fruit and juice). Meats High-fat meats, fish, and poultry. This includes hot dogs, ribs, ham, sausage, salami, and bacon. Dairy Whole milk and chocolate milk. Sour cream. Cream. Butter. Ice cream. Cream cheese. Drinks Coffee and tea. Bubbly (carbonated) drinks or energy drinks. Condiments Hot sauce. Barbecue sauce. Sweets/Desserts Chocolate and cocoa. Donuts. Peppermint and spearmint. Fats and Oils High-fat foods. This includes French fries and potato chips. Other Vinegar. Strong spices. This includes black pepper, white pepper, red pepper, cayenne, curry powder, cloves, ginger, and chili powder. The items listed above may not be a complete list of foods and drinks to avoid. Contact your dietitian for more information. This  information is not intended to replace advice given to you by your health care provider. Make sure you discuss any questions you have with your health care provider. Document Released: 07/28/2011 Document Revised: 07/04/2015 Document Reviewed: 11/30/2012 Elsevier Interactive Patient Education  2017 Elsevier Inc.  Gastroesophageal Reflux Disease, Adult Normally, food travels down the esophagus and stays in the stomach to be digested. If a person has gastroesophageal reflux disease (GERD), food and stomach acid move back up into the esophagus. When this happens, the esophagus becomes sore and swollen (inflamed). Over time, GERD can make small holes (ulcers) in the lining of the esophagus. Follow these instructions at home: Diet  Follow a diet as told by your doctor. You may need to avoid foods and drinks such as: ? Coffee and tea (with or without caffeine). ? Drinks that contain alcohol. ? Energy drinks and sports drinks. ? Carbonated drinks or sodas. ? Chocolate and cocoa. ? Peppermint and mint flavorings. ? Garlic and onions. ? Horseradish. ? Spicy and acidic foods, such as peppers, chili powder, curry powder, vinegar, hot sauces, and BBQ sauce. ? Citrus fruit juices and citrus fruits, such as oranges, lemons, and limes. ? Tomato-based foods, such as red sauce, chili, salsa, and pizza with red sauce. ? Fried and fatty foods, such as donuts, french fries, potato chips, and high-fat dressings. ? High-fat meats, such as hot dogs, rib eye steak, sausage, ham, and bacon. ? High-fat dairy items, such as whole milk, butter, and cream cheese.  Eat small meals often. Avoid eating large meals.  Avoid drinking large amounts of liquid with your meals.  Avoid eating meals during the 2-3 hours before bedtime.  Avoid lying down right after you eat.  Do   not exercise right after you eat. General instructions  Pay attention to any changes in your symptoms.  Take over-the-counter and prescription  medicines only as told by your doctor. Do not take aspirin, ibuprofen, or other NSAIDs unless your doctor says it is okay.  Do not use any tobacco products, including cigarettes, chewing tobacco, and e-cigarettes. If you need help quitting, ask your doctor.  Wear loose clothes. Do not wear anything tight around your waist.  Raise (elevate) the head of your bed about 6 inches (15 cm).  Try to lower your stress. If you need help doing this, ask your doctor.  If you are overweight, lose an amount of weight that is healthy for you. Ask your doctor about a safe weight loss goal.  Keep all follow-up visits as told by your doctor. This is important. Contact a doctor if:  You have new symptoms.  You lose weight and you do not know why it is happening.  You have trouble swallowing, or it hurts to swallow.  You have wheezing or a cough that keeps happening.  Your symptoms do not get better with treatment.  You have a hoarse voice. Get help right away if:  You have pain in your arms, neck, jaw, teeth, or back.  You feel sweaty, dizzy, or light-headed.  You have chest pain or shortness of breath.  You throw up (vomit) and your throw up looks like blood or coffee grounds.  You pass out (faint).  Your poop (stool) is bloody or black.  You cannot swallow, drink, or eat. This information is not intended to replace advice given to you by your health care provider. Make sure you discuss any questions you have with your health care provider. Document Released: 07/15/2007 Document Revised: 07/04/2015 Document Reviewed: 05/23/2014 Elsevier Interactive Patient Education  2018 Elsevier Inc.  

## 2017-10-27 NOTE — MAU Note (Signed)
Urine in lab 

## 2017-10-28 LAB — GC/CHLAMYDIA PROBE AMP (~~LOC~~) NOT AT ARMC
CHLAMYDIA, DNA PROBE: NEGATIVE
NEISSERIA GONORRHEA: NEGATIVE

## 2017-10-29 ENCOUNTER — Ambulatory Visit (HOSPITAL_COMMUNITY): Payer: Medicaid Other

## 2017-10-29 ENCOUNTER — Encounter (HOSPITAL_COMMUNITY): Payer: Medicaid Other

## 2017-10-29 ENCOUNTER — Ambulatory Visit (HOSPITAL_COMMUNITY)
Admission: RE | Admit: 2017-10-29 | Discharge: 2017-10-29 | Disposition: A | Discharge status: Home / Self Care | Payer: Medicaid Other | Source: Ambulatory Visit | Attending: Nurse Practitioner | Admitting: Nurse Practitioner

## 2017-11-05 ENCOUNTER — Ambulatory Visit (HOSPITAL_COMMUNITY)
Admission: RE | Admit: 2017-11-05 | Discharge: 2017-11-05 | Disposition: A | Discharge status: Home / Self Care | Payer: Medicaid Other | Source: Ambulatory Visit | Attending: Obstetrics and Gynecology | Admitting: Obstetrics and Gynecology

## 2017-11-05 ENCOUNTER — Encounter (HOSPITAL_COMMUNITY): Payer: Self-pay

## 2017-11-05 DIAGNOSIS — Z3A21 21 weeks gestation of pregnancy: Secondary | ICD-10-CM | POA: Diagnosis not present

## 2017-11-05 DIAGNOSIS — Z362 Encounter for other antenatal screening follow-up: Secondary | ICD-10-CM | POA: Diagnosis not present

## 2017-11-05 DIAGNOSIS — O352XX0 Maternal care for (suspected) hereditary disease in fetus, not applicable or unspecified: Secondary | ICD-10-CM | POA: Diagnosis not present

## 2017-11-08 ENCOUNTER — Other Ambulatory Visit (HOSPITAL_COMMUNITY): Payer: Self-pay | Admitting: *Deleted

## 2017-11-08 DIAGNOSIS — Z362 Encounter for other antenatal screening follow-up: Secondary | ICD-10-CM

## 2017-12-03 ENCOUNTER — Ambulatory Visit (HOSPITAL_COMMUNITY)
Admission: RE | Admit: 2017-12-03 | Discharge: 2017-12-03 | Disposition: A | Discharge status: Home / Self Care | Payer: Medicaid Other | Source: Ambulatory Visit | Attending: Nurse Practitioner | Admitting: Nurse Practitioner

## 2017-12-03 ENCOUNTER — Encounter (HOSPITAL_COMMUNITY): Payer: Self-pay

## 2017-12-03 DIAGNOSIS — O352XX0 Maternal care for (suspected) hereditary disease in fetus, not applicable or unspecified: Secondary | ICD-10-CM | POA: Diagnosis not present

## 2017-12-03 DIAGNOSIS — Z362 Encounter for other antenatal screening follow-up: Secondary | ICD-10-CM | POA: Insufficient documentation

## 2017-12-03 DIAGNOSIS — O9989 Other specified diseases and conditions complicating pregnancy, childbirth and the puerperium: Secondary | ICD-10-CM

## 2017-12-03 DIAGNOSIS — M549 Dorsalgia, unspecified: Secondary | ICD-10-CM

## 2017-12-03 DIAGNOSIS — Z3A25 25 weeks gestation of pregnancy: Secondary | ICD-10-CM | POA: Insufficient documentation

## 2017-12-06 ENCOUNTER — Other Ambulatory Visit (HOSPITAL_COMMUNITY): Payer: Self-pay | Admitting: *Deleted

## 2017-12-06 DIAGNOSIS — Z8279 Family history of other congenital malformations, deformations and chromosomal abnormalities: Secondary | ICD-10-CM

## 2017-12-16 ENCOUNTER — Encounter (HOSPITAL_COMMUNITY): Payer: Self-pay

## 2017-12-16 DIAGNOSIS — O09292 Supervision of pregnancy with other poor reproductive or obstetric history, second trimester: Secondary | ICD-10-CM

## 2017-12-31 ENCOUNTER — Ambulatory Visit (HOSPITAL_COMMUNITY)
Admission: RE | Admit: 2017-12-31 | Discharge: 2017-12-31 | Disposition: A | Discharge status: Home / Self Care | Payer: Medicaid Other | Source: Ambulatory Visit | Attending: Nurse Practitioner | Admitting: Nurse Practitioner

## 2017-12-31 DIAGNOSIS — Z3689 Encounter for other specified antenatal screening: Secondary | ICD-10-CM | POA: Diagnosis not present

## 2017-12-31 DIAGNOSIS — O352XX0 Maternal care for (suspected) hereditary disease in fetus, not applicable or unspecified: Secondary | ICD-10-CM

## 2017-12-31 DIAGNOSIS — Z3A29 29 weeks gestation of pregnancy: Secondary | ICD-10-CM | POA: Insufficient documentation

## 2017-12-31 DIAGNOSIS — Z8279 Family history of other congenital malformations, deformations and chromosomal abnormalities: Secondary | ICD-10-CM

## 2018-01-31 ENCOUNTER — Inpatient Hospital Stay (HOSPITAL_COMMUNITY)
Admission: AD | Admit: 2018-01-31 | Discharge: 2018-01-31 | Disposition: A | Discharge status: Home / Self Care | Payer: Medicaid Other | Attending: Obstetrics & Gynecology | Admitting: Obstetrics & Gynecology

## 2018-01-31 ENCOUNTER — Encounter (HOSPITAL_COMMUNITY): Payer: Self-pay | Admitting: Advanced Practice Midwife

## 2018-01-31 DIAGNOSIS — O219 Vomiting of pregnancy, unspecified: Secondary | ICD-10-CM

## 2018-01-31 DIAGNOSIS — R112 Nausea with vomiting, unspecified: Secondary | ICD-10-CM | POA: Diagnosis present

## 2018-01-31 DIAGNOSIS — O26893 Other specified pregnancy related conditions, third trimester: Secondary | ICD-10-CM

## 2018-01-31 DIAGNOSIS — O212 Late vomiting of pregnancy: Secondary | ICD-10-CM | POA: Diagnosis not present

## 2018-01-31 DIAGNOSIS — Z3A34 34 weeks gestation of pregnancy: Secondary | ICD-10-CM | POA: Diagnosis not present

## 2018-01-31 DIAGNOSIS — Z3689 Encounter for other specified antenatal screening: Secondary | ICD-10-CM

## 2018-01-31 LAB — CBC WITH DIFFERENTIAL/PLATELET
Basophils Absolute: 0 10*3/uL (ref 0.0–0.1)
Basophils Relative: 0 %
Eosinophils Absolute: 0.2 10*3/uL (ref 0.0–0.5)
Eosinophils Relative: 1 %
HEMATOCRIT: 32.4 % — AB (ref 36.0–46.0)
Hemoglobin: 9.9 g/dL — ABNORMAL LOW (ref 12.0–15.0)
LYMPHS ABS: 1.3 10*3/uL (ref 0.7–4.0)
Lymphocytes Relative: 10 %
MCH: 25.3 pg — ABNORMAL LOW (ref 26.0–34.0)
MCHC: 30.6 g/dL (ref 30.0–36.0)
MCV: 82.7 fL (ref 80.0–100.0)
Monocytes Absolute: 0.3 10*3/uL (ref 0.1–1.0)
Monocytes Relative: 2 %
NEUTROS ABS: 11.7 10*3/uL — AB (ref 1.7–7.7)
Neutrophils Relative %: 87 %
Platelets: 248 10*3/uL (ref 150–400)
RBC: 3.92 MIL/uL (ref 3.87–5.11)
RDW: 15.5 % (ref 11.5–15.5)
WBC: 13.4 10*3/uL — ABNORMAL HIGH (ref 4.0–10.5)
nRBC: 0 % (ref 0.0–0.2)

## 2018-01-31 LAB — COMPREHENSIVE METABOLIC PANEL
ALT: 18 U/L (ref 0–44)
AST: 24 U/L (ref 15–41)
Albumin: 3.1 g/dL — ABNORMAL LOW (ref 3.5–5.0)
Alkaline Phosphatase: 97 U/L (ref 38–126)
Anion gap: 8 (ref 5–15)
BUN: 6 mg/dL (ref 6–20)
CALCIUM: 8.7 mg/dL — AB (ref 8.9–10.3)
CO2: 17 mmol/L — ABNORMAL LOW (ref 22–32)
CREATININE: 0.49 mg/dL (ref 0.44–1.00)
Chloride: 110 mmol/L (ref 98–111)
GFR calc Af Amer: >60 mL/min (ref >60)
GFR calc non Af Amer: >60 mL/min (ref >60)
Glucose, Bld: 84 mg/dL (ref 70–99)
Potassium: 4.1 mmol/L (ref 3.5–5.1)
Sodium: 135 mmol/L (ref 135–145)
TOTAL PROTEIN: 6.7 g/dL (ref 6.5–8.1)
Total Bilirubin: 0.2 mg/dL — ABNORMAL LOW (ref 0.3–1.2)

## 2018-01-31 LAB — AMYLASE: Amylase: 87 U/L (ref 28–100)

## 2018-01-31 LAB — URINALYSIS, ROUTINE W REFLEX MICROSCOPIC
Bilirubin Urine: NEGATIVE
GLUCOSE, UA: NEGATIVE mg/dL
Hgb urine dipstick: NEGATIVE
KETONES UR: NEGATIVE mg/dL
NITRITE: NEGATIVE
PH: 7 (ref 5.0–8.0)
Protein, ur: NEGATIVE mg/dL
SPECIFIC GRAVITY, URINE: 1.006 (ref 1.005–1.030)

## 2018-01-31 LAB — LIPASE, BLOOD: Lipase: 38 U/L (ref 11–51)

## 2018-01-31 MED ORDER — ACETAMINOPHEN 325 MG PO TABS
650.0000 mg | ORAL_TABLET | Freq: Once | ORAL | Status: AC
  Administered 2018-01-31: 650 mg via ORAL
  Filled 2018-01-31: qty 2

## 2018-01-31 MED ORDER — ALUM & MAG HYDROXIDE-SIMETH 200-200-20 MG/5ML PO SUSP
30.0000 mL | Freq: Once | ORAL | Status: AC
  Administered 2018-01-31: 30 mL via ORAL
  Filled 2018-01-31: qty 30

## 2018-01-31 MED ORDER — LACTATED RINGERS IV SOLN
Freq: Once | INTRAVENOUS | Status: AC
  Administered 2018-01-31: 11:00:00 via INTRAVENOUS

## 2018-01-31 MED ORDER — DOXYLAMINE-PYRIDOXINE ER 20-20 MG PO TBCR: 1.0000 | EXTENDED_RELEASE_TABLET | Freq: Two times a day (BID) | ORAL | 0 refills | Status: DC

## 2018-01-31 NOTE — MAU Provider Note (Signed)
History     CSN: [id number]  Arrival date and time: 01/31/18 [id number]   First Provider Initiated Contact with Patient 01/31/18 1018      Chief Complaint  Patient presents with  . Emesis  . Nausea   HPI Elizabeth Summers is a 22 y.o. G3P1011 at 2254w0d who presents to MAU with chief complaint of nausea and vomiting in pregnancy. This is a new problem, onset this morning. Patient states she has had two episodes of vomiting. She is tolerating liquids. She endorses a normal bowel movement this morning. Patient also complains of 6/10 pain across the top of her abdomen. This is a new problem, onset coincides with onset of vomiting this morning. Pain does not radiate. She denies aggravating or alleviating factors. Patient has not taken medication or tried other treatments. She denies vaginal bleeding, leaking of fluid, decreased fetal movement, fever, falls, or recent illness.    [facility]  OB History    Gravida  3   Para  1   Term  1   Preterm      AB  1   Living  1     SAB  1   TAB      Ectopic      Multiple      Live Births              Past Medical History:  Diagnosis Date  . Medical history non-contributory     Past Surgical History:  Procedure Laterality Date  . NO PAST SURGERIES      No family history on file.  Social History   Tobacco Use  . Smoking status: Never Smoker  . Smokeless tobacco: Never Used  Substance Use Topics  . Alcohol use: No  . Drug use: No    Allergies: No Known Allergies  Medications Prior to Admission  Medication Sig Dispense Refill Last Dose  . acetaminophen (TYLENOL) 325 MG tablet Take 2 tablets (650 mg total) by mouth every 6 (six) hours as needed. (Patient not taking: Reported on 09/22/2017) 30 tablet 0 Not Taking  . Prenatal Vit-Fe Fumarate-FA (PRENATAL VITAMIN PO) Take by mouth.   Taking  . ranitidine (ZANTAC) 150 MG capsule Take 1 capsule (150 mg total) by mouth every evening. (Patient not taking:  Reported on 11/05/2017) 30 capsule 1 Not Taking    Review of Systems  Constitutional: Negative for chills, fatigue and fever.  Gastrointestinal: Positive for abdominal pain, nausea and vomiting.  Genitourinary: Negative for vaginal bleeding.  Musculoskeletal: Negative for back pain.  Neurological: Negative for headaches.  All other systems reviewed and are negative.  Physical Exam   Blood pressure (!) 112/53, pulse (!) 109, temperature 97.9 F (36.6 C), temperature source Oral, resp. rate 18, weight 65.5 kg, last menstrual period 06/18/2017.  Physical Exam  Nursing note and vitals reviewed. Constitutional: She is oriented to person, place, and time. She appears well-developed and well-nourished.  Respiratory: Effort normal.  GI: Soft. She exhibits no distension. There is no abdominal tenderness. There is no rebound and no guarding.  Gravid  Genitourinary:    Genitourinary Comments: Cervix closed/thick/posterior   Neurological: She is alert and oriented to person, place, and time.  Skin: Skin is warm and dry.  Psychiatric: She has a normal mood and affect. Her behavior is normal. Judgment and thought content normal.    MAU Course/MDM   --History term SVD --Closed cervix --occasional mild contractions resolving with IV hydration and Tylenol --Patient sleeping  after med administration in MAU --Reactive tracing: baseline 135, moderate variability, positive accels, no decels --Toco: irregular occasional contractions not felt by patient --Elevated WBCs and left shift. Possible viral illness. Afebrile. Tolerating PO prior to discharge  Patient Vitals for the past 24 hrs:  BP Temp Temp src Pulse Resp Weight  01/31/18 1223 (!) 97/59 - - 89 16 -  01/31/18 1005 (!) 112/53 - - (!) 109 - -  01/31/18 1003 - 97.9 F (36.6 C) Oral - 18 -  01/31/18 0959 - - - - - 65.5 kg    Results for orders placed or performed during the hospital encounter of 01/31/18 (from the past 24 hour(s))   Urinalysis, Routine w reflex microscopic     Status: Abnormal   Collection Time: 01/31/18  9:55 AM  Result Value Ref Range   Color, Urine YELLOW YELLOW   APPearance CLEAR CLEAR   Specific Gravity, Urine 1.006 1.005 - 1.030   pH 7.0 5.0 - 8.0   Glucose, UA NEGATIVE NEGATIVE mg/dL   Hgb urine dipstick NEGATIVE NEGATIVE   Bilirubin Urine NEGATIVE NEGATIVE   Ketones, ur NEGATIVE NEGATIVE mg/dL   Protein, ur NEGATIVE NEGATIVE mg/dL   Nitrite NEGATIVE NEGATIVE   Leukocytes, UA MODERATE (A) NEGATIVE   RBC / HPF 0-5 0 - 5 RBC/hpf   WBC, UA 21-50 0 - 5 WBC/hpf   Bacteria, UA RARE (A) NONE SEEN   Squamous Epithelial / LPF 0-5 0 - 5   Mucus PRESENT   CBC with Differential/Platelet     Status: Abnormal   Collection Time: 01/31/18 10:37 AM  Result Value Ref Range   WBC 13.4 (H) 4.0 - 10.5 K/uL   RBC 3.92 3.87 - 5.11 MIL/uL   Hemoglobin 9.9 (L) 12.0 - 15.0 g/dL   HCT 04.532.4 (L) 40.936.0 - 81.146.0 %   MCV 82.7 80.0 - 100.0 fL   MCH 25.3 (L) 26.0 - 34.0 pg   MCHC 30.6 30.0 - 36.0 g/dL   RDW 91.415.5 78.211.5 - 95.615.5 %   Platelets 248 150 - 400 K/uL   nRBC 0.0 0.0 - 0.2 %   Neutrophils Relative % 87 %   Neutro Abs 11.7 (H) 1.7 - 7.7 K/uL   Lymphocytes Relative 10 %   Lymphs Abs 1.3 0.7 - 4.0 K/uL   Monocytes Relative 2 %   Monocytes Absolute 0.3 0.1 - 1.0 K/uL   Eosinophils Relative 1 %   Eosinophils Absolute 0.2 0.0 - 0.5 K/uL   Basophils Relative 0 %   Basophils Absolute 0.0 0.0 - 0.1 K/uL  Comprehensive metabolic panel     Status: Abnormal   Collection Time: 01/31/18 10:37 AM  Result Value Ref Range   Sodium 135 135 - 145 mmol/L   Potassium 4.1 3.5 - 5.1 mmol/L   Chloride 110 98 - 111 mmol/L   CO2 17 (L) 22 - 32 mmol/L   Glucose, Bld 84 70 - 99 mg/dL   BUN 6 6 - 20 mg/dL   Creatinine, Ser 2.130.49 0.44 - 1.00 mg/dL   Calcium 8.7 (L) 8.9 - 10.3 mg/dL   Total Protein 6.7 6.5 - 8.1 g/dL   Albumin 3.1 (L) 3.5 - 5.0 g/dL   AST 24 15 - 41 U/L   ALT 18 0 - 44 U/L   Alkaline Phosphatase 97 38 - 126  U/L   Total Bilirubin 0.2 (L) 0.3 - 1.2 mg/dL   GFR calc non Af Amer >60 >60 mL/min   GFR calc Af Amer >  60 >60 mL/min   Anion gap 8 5 - 15  Amylase     Status: None   Collection Time: 01/31/18 10:37 AM  Result Value Ref Range   Amylase 87 28 - 100 U/L  Lipase, blood     Status: None   Collection Time: 01/31/18 10:37 AM  Result Value Ref Range   Lipase 38 11 - 51 U/L    Meds ordered this encounter  Medications  . lactated ringers infusion  . alum & mag hydroxide-simeth (MAALOX/MYLANTA) 200-200-20 MG/5ML suspension 30 mL  . acetaminophen (TYLENOL) tablet 650 mg  . Doxylamine-Pyridoxine ER (BONJESTA) 20-20 MG TBCR    Sig: Take 1 tablet by mouth 2 (two) times daily at 8 am and 10 pm.    Dispense:  60 tablet    Refill:  0    Order Specific Question:   Supervising Provider    Answer:   Reva Bores [[id number]]   Assessment and Plan  --22 y.o. G3P1011 at [redacted]w[redacted]d  --Reactive fetal tracing, closed cervix --Nausea and vomiting in pregnancy, rx to pharmacy --Possible viral illness, symptom management --Urine culture pending, follow up PRN --Discharge home in stable condition  F/U: Return to MAU for worsening condition or symptoms not responsive to medications  Calvert Cantor, CNM 01/31/2018, 1:25 PM

## 2018-01-31 NOTE — MAU Note (Signed)
Pt also complains of contractions. 8/10, seem to come and go every few minutes

## 2018-01-31 NOTE — MAU Note (Signed)
Started vomiting this morning.  Denies fever or diarrhea.

## 2018-01-31 NOTE — Discharge Instructions (Signed)
Safe Medications in Pregnancy   Acne: Benzoyl Peroxide Salicylic Acid  Backache/Headache: Tylenol: 2 regular strength every 4 hours OR              2 Extra strength every 6 hours  Colds/Coughs/Allergies: Benadryl (alcohol free) 25 mg every 6 hours as needed Breath right strips Claritin Cepacol throat lozenges Chloraseptic throat spray Cold-Eeze- up to three times per day Cough drops, alcohol free Flonase (by prescription only) Guaifenesin Mucinex Robitussin DM (plain only, alcohol free) Saline nasal spray/drops Sudafed (pseudoephedrine) & Actifed ** use only after [redacted] weeks gestation and if you do not have high blood pressure Tylenol Vicks Vaporub Zinc lozenges Zyrtec   Constipation: Colace Ducolax suppositories Fleet enema Glycerin suppositories Metamucil Milk of magnesia Miralax Senokot Smooth move tea  Diarrhea: Kaopectate Imodium A-D  *NO pepto Bismol  Hemorrhoids: Anusol Anusol HC Preparation H Tucks  Indigestion: Tums Maalox Mylanta Zantac  Pepcid  Insomnia: Benadryl (alcohol free) 25mg  every 6 hours as needed Tylenol PM Unisom, no Gelcaps  Leg Cramps: Tums MagGel  Nausea/Vomiting:  Bonine Dramamine Emetrol Ginger extract Sea bands Meclizine  Nausea medication to take during pregnancy:  Unisom (doxylamine succinate 25 mg tablets) Take one tablet daily at bedtime. If symptoms are not adequately controlled, the dose can be increased to a maximum recommended dose of two tablets daily (1/2 tablet in the morning, 1/2 tablet mid-afternoon and one at bedtime). Vitamin B6 100mg  tablets. Take one tablet twice a day (up to 200 mg per day).  Skin Rashes: Aveeno products Benadryl cream or 25mg  every 6 hours as needed Calamine Lotion 1% cortisone cream  Yeast infection: Gyne-lotrimin 7 Monistat 7   **If taking multiple medications, please check labels to avoid duplicating the same active ingredients **take medication as directed on  the label ** Do not exceed 4000 mg of tylenol in 24 hours **Do not take medications that contain aspirin or ibuprofen   Safe Medications in Pregnancy   Acne: Benzoyl Peroxide Salicylic Acid  Backache/Headache: Tylenol: 2 regular strength every 4 hours OR              2 Extra strength every 6 hours  Colds/Coughs/Allergies: Benadryl (alcohol free) 25 mg every 6 hours as needed Breath right strips Claritin Cepacol throat lozenges Chloraseptic throat spray Cold-Eeze- up to three times per day Cough drops, alcohol free Flonase (by prescription only) Guaifenesin Mucinex Robitussin DM (plain only, alcohol free) Saline nasal spray/drops Sudafed (pseudoephedrine) & Actifed ** use only after [redacted] weeks gestation and if you do not have high blood pressure Tylenol Vicks Vaporub Zinc lozenges Zyrtec   Constipation: Colace Ducolax suppositories Fleet enema Glycerin suppositories Metamucil Milk of magnesia Miralax Senokot Smooth move tea  Diarrhea: Kaopectate Imodium A-D  *NO pepto Bismol  Hemorrhoids: Anusol Anusol HC Preparation H Tucks  Indigestion: Tums Maalox Mylanta Zantac  Pepcid  Insomnia: Benadryl (alcohol free) 25mg  every 6 hours as needed Tylenol PM Unisom, no Gelcaps  Leg Cramps: Tums MagGel  Nausea/Vomiting:  Bonine Dramamine Emetrol Ginger extract Sea bands Meclizine  Nausea medication to take during pregnancy:  Unisom (doxylamine succinate 25 mg tablets) Take one tablet daily at bedtime. If symptoms are not adequately controlled, the dose can be increased to a maximum recommended dose of two tablets daily (1/2 tablet in the morning, 1/2 tablet mid-afternoon and one at bedtime). Vitamin B6 100mg  tablets. Take one tablet twice a day (up to 200 mg per day).  Skin Rashes: Aveeno products Benadryl cream or 25mg  every 6 hours  as needed Calamine Lotion 1% cortisone cream  Yeast infection: Gyne-lotrimin 7 Monistat 7   **If taking  multiple medications, please check labels to avoid duplicating the same active ingredients **take medication as directed on the label ** Do not exceed 4000 mg of tylenol in 24 hours **Do not take medications that contain aspirin or ibuprofen

## 2018-02-01 LAB — CULTURE, OB URINE: Culture: 60000 — AB

## 2018-02-09 NOTE — L&D Delivery Note (Signed)
Delivery Note After a 10 m inute 2nd stage, At 9:02 PM a viable female was delivered via Vaginal, Spontaneous (Presentation: ROA  ).  APGAR: 9, 9; weight pending  After 1 minute, the cord was clamped and cut. 40 units of pitocin diluted in 1000cc LR was infused rapidly IV.  The placenta separated spontaneously and delivered via CCT and maternal pushing effort.  It was inspected and appears to be intact with a 3 VC.   Marland Kitchen    Anesthesia:  Epidural/local Episiotomy: None Lacerations: 2nd degree Suture Repair: 2.0 vicryl Est. Blood Loss (mL):  554  Mom to postpartum.  Baby to Couplet care / Skin to Skin.   Delivery and repair by Dr/ Payton Spark under my direct supervision.  Fancy Eighmy 03/21/2018, 9:40 PM

## 2018-02-17 LAB — OB RESULTS CONSOLE GBS: GBS: NEGATIVE

## 2018-03-15 ENCOUNTER — Encounter (HOSPITAL_COMMUNITY): Payer: Self-pay

## 2018-03-15 ENCOUNTER — Telehealth (HOSPITAL_COMMUNITY): Payer: Self-pay | Admitting: *Deleted

## 2018-03-15 ENCOUNTER — Inpatient Hospital Stay (HOSPITAL_COMMUNITY)
Admission: AD | Admit: 2018-03-15 | Discharge: 2018-03-16 | Disposition: A | Discharge status: Home / Self Care | Payer: Medicaid Other | Attending: Family Medicine | Admitting: Family Medicine

## 2018-03-15 ENCOUNTER — Encounter (HOSPITAL_COMMUNITY): Payer: Self-pay | Admitting: *Deleted

## 2018-03-15 ENCOUNTER — Other Ambulatory Visit: Payer: Self-pay

## 2018-03-15 DIAGNOSIS — O471 False labor at or after 37 completed weeks of gestation: Secondary | ICD-10-CM | POA: Diagnosis not present

## 2018-03-15 DIAGNOSIS — Z3A40 40 weeks gestation of pregnancy: Secondary | ICD-10-CM | POA: Diagnosis not present

## 2018-03-15 DIAGNOSIS — O26893 Other specified pregnancy related conditions, third trimester: Secondary | ICD-10-CM

## 2018-03-15 DIAGNOSIS — O479 False labor, unspecified: Secondary | ICD-10-CM

## 2018-03-15 DIAGNOSIS — R109 Unspecified abdominal pain: Secondary | ICD-10-CM

## 2018-03-15 NOTE — Telephone Encounter (Signed)
[phone] interpreter number  Preadmission screen

## 2018-03-15 NOTE — MAU Note (Signed)
Pt here for contractions. Denies LOF or vaginal bleeding. Reports good fetal movement.

## 2018-03-16 DIAGNOSIS — O479 False labor, unspecified: Secondary | ICD-10-CM

## 2018-03-16 DIAGNOSIS — O471 False labor at or after 37 completed weeks of gestation: Secondary | ICD-10-CM | POA: Diagnosis not present

## 2018-03-16 LAB — URINALYSIS, ROUTINE W REFLEX MICROSCOPIC
Bilirubin Urine: NEGATIVE
GLUCOSE, UA: NEGATIVE mg/dL
Hgb urine dipstick: NEGATIVE
Ketones, ur: NEGATIVE mg/dL
Nitrite: NEGATIVE
Protein, ur: NEGATIVE mg/dL
Specific Gravity, Urine: 1.015 (ref 1.005–1.030)
pH: 6.5 (ref 5.0–8.0)

## 2018-03-16 LAB — URINALYSIS, MICROSCOPIC (REFLEX)

## 2018-03-16 NOTE — Progress Notes (Signed)
RN labor evaluation. Vital signed reviewed. Cervix unchanged while in MAU. Reactive NST.  Patient has f/u appt at [facility] on Thursday and is scheduled for a 41 wk IOL.   NST:  Baseline: 120 bpm, Variability: Good {> 6 bpm), Accelerations: Reactive and Decelerations: Absent   Judeth Horn, NP

## 2018-03-16 NOTE — Discharge Instructions (Signed)

## 2018-03-21 ENCOUNTER — Inpatient Hospital Stay (HOSPITAL_COMMUNITY)
Admission: RE | Admit: 2018-03-21 | Discharge: 2018-03-23 | DRG: 807 | Disposition: A | Discharge status: Home / Self Care | Payer: Medicaid Other | Attending: Obstetrics and Gynecology | Admitting: Obstetrics and Gynecology

## 2018-03-21 ENCOUNTER — Inpatient Hospital Stay (HOSPITAL_COMMUNITY): Payer: Medicaid Other | Admitting: Anesthesiology

## 2018-03-21 ENCOUNTER — Encounter (HOSPITAL_COMMUNITY): Payer: Self-pay

## 2018-03-21 DIAGNOSIS — M549 Dorsalgia, unspecified: Secondary | ICD-10-CM

## 2018-03-21 DIAGNOSIS — O9989 Other specified diseases and conditions complicating pregnancy, childbirth and the puerperium: Secondary | ICD-10-CM

## 2018-03-21 DIAGNOSIS — O480 Post-term pregnancy: Principal | ICD-10-CM | POA: Diagnosis present

## 2018-03-21 DIAGNOSIS — O09292 Supervision of pregnancy with other poor reproductive or obstetric history, second trimester: Secondary | ICD-10-CM

## 2018-03-21 DIAGNOSIS — Z3A41 41 weeks gestation of pregnancy: Secondary | ICD-10-CM

## 2018-03-21 LAB — CBC
HCT: 32.7 % — ABNORMAL LOW (ref 36.0–46.0)
Hemoglobin: 10.3 g/dL — ABNORMAL LOW (ref 12.0–15.0)
MCH: 25.9 pg — ABNORMAL LOW (ref 26.0–34.0)
MCHC: 31.5 g/dL (ref 30.0–36.0)
MCV: 82.2 fL (ref 80.0–100.0)
PLATELETS: 217 10*3/uL (ref 150–400)
RBC: 3.98 MIL/uL (ref 3.87–5.11)
RDW: 14.9 % (ref 11.5–15.5)
WBC: 9.3 10*3/uL (ref 4.0–10.5)
nRBC: 0 % (ref 0.0–0.2)

## 2018-03-21 LAB — ABO/RH: ABO/RH(D): A POS

## 2018-03-21 LAB — TYPE AND SCREEN
ABO/RH(D): A POS
Antibody Screen: NEGATIVE

## 2018-03-21 LAB — RPR: RPR Ser Ql: NONREACTIVE

## 2018-03-21 MED ORDER — TERBUTALINE SULFATE 1 MG/ML IJ SOLN
0.2500 mg | Freq: Once | INTRAMUSCULAR | Status: DC | PRN
  Filled 2018-03-21: qty 1

## 2018-03-21 MED ORDER — EPHEDRINE 5 MG/ML INJ
10.0000 mg | INTRAVENOUS | Status: DC | PRN
  Filled 2018-03-21: qty 2

## 2018-03-21 MED ORDER — MEASLES, MUMPS & RUBELLA VAC IJ SOLR
0.5000 mL | Freq: Once | INTRAMUSCULAR | Status: DC
  Filled 2018-03-21: qty 0.5

## 2018-03-21 MED ORDER — OXYCODONE-ACETAMINOPHEN 5-325 MG PO TABS: 2.0000 | ORAL_TABLET | ORAL | Status: DC | PRN

## 2018-03-21 MED ORDER — OXYTOCIN BOLUS FROM INFUSION
500.0000 mL | Freq: Once | INTRAVENOUS | Status: AC
  Administered 2018-03-21: 500 mL via INTRAVENOUS

## 2018-03-21 MED ORDER — FENTANYL CITRATE (PF) 100 MCG/2ML IJ SOLN: 50.0000 ug | INTRAMUSCULAR | Status: DC | PRN

## 2018-03-21 MED ORDER — PHENYLEPHRINE 40 MCG/ML (10ML) SYRINGE FOR IV PUSH (FOR BLOOD PRESSURE SUPPORT)
80.0000 ug | PREFILLED_SYRINGE | INTRAVENOUS | Status: DC | PRN
  Filled 2018-03-21: qty 10

## 2018-03-21 MED ORDER — OXYTOCIN 40 UNITS IN NORMAL SALINE INFUSION - SIMPLE MED
1.0000 m[IU]/min | INTRAVENOUS | Status: DC
  Administered 2018-03-21: 2 m[IU]/min via INTRAVENOUS
  Filled 2018-03-21: qty 1000

## 2018-03-21 MED ORDER — COCONUT OIL OIL: 1.0000 "application " | TOPICAL_OIL | Status: DC | PRN

## 2018-03-21 MED ORDER — LIDOCAINE HCL (PF) 1 % IJ SOLN
30.0000 mL | INTRAMUSCULAR | Status: DC | PRN
  Administered 2018-03-21: 30 mL via SUBCUTANEOUS
  Filled 2018-03-21: qty 30

## 2018-03-21 MED ORDER — LIDOCAINE HCL (PF) 1 % IJ SOLN
INTRAMUSCULAR | Status: DC | PRN
  Administered 2018-03-21 (×2): 5 mL via EPIDURAL

## 2018-03-21 MED ORDER — FLEET ENEMA 7-19 GM/118ML RE ENEM: 1.0000 | ENEMA | RECTAL | Status: DC | PRN

## 2018-03-21 MED ORDER — METHYLERGONOVINE MALEATE 0.2 MG PO TABS: 0.2000 mg | ORAL_TABLET | ORAL | Status: DC | PRN

## 2018-03-21 MED ORDER — FLEET ENEMA 7-19 GM/118ML RE ENEM: 1.0000 | ENEMA | Freq: Every day | RECTAL | Status: DC | PRN

## 2018-03-21 MED ORDER — TETANUS-DIPHTH-ACELL PERTUSSIS 5-2.5-18.5 LF-MCG/0.5 IM SUSP: 0.5000 mL | Freq: Once | INTRAMUSCULAR | Status: DC

## 2018-03-21 MED ORDER — OXYTOCIN 40 UNITS IN NORMAL SALINE INFUSION - SIMPLE MED: 2.5000 [IU]/h | INTRAVENOUS | Status: DC

## 2018-03-21 MED ORDER — PRENATAL MULTIVITAMIN CH
1.0000 | ORAL_TABLET | Freq: Every day | ORAL | Status: DC
  Administered 2018-03-22 – 2018-03-23 (×2): 1 via ORAL
  Filled 2018-03-21 (×2): qty 1

## 2018-03-21 MED ORDER — DOCUSATE SODIUM 100 MG PO CAPS
100.0000 mg | ORAL_CAPSULE | Freq: Two times a day (BID) | ORAL | Status: DC
  Administered 2018-03-22 – 2018-03-23 (×3): 100 mg via ORAL
  Filled 2018-03-21 (×3): qty 1

## 2018-03-21 MED ORDER — ZOLPIDEM TARTRATE 5 MG PO TABS: 5.0000 mg | ORAL_TABLET | Freq: Every evening | ORAL | Status: DC | PRN

## 2018-03-21 MED ORDER — MISOPROSTOL 25 MCG QUARTER TABLET
25.0000 ug | ORAL_TABLET | ORAL | Status: DC | PRN
  Administered 2018-03-21: 25 ug via VAGINAL
  Filled 2018-03-21 (×2): qty 1

## 2018-03-21 MED ORDER — FERROUS SULFATE 325 (65 FE) MG PO TABS
325.0000 mg | ORAL_TABLET | Freq: Two times a day (BID) | ORAL | Status: DC
  Administered 2018-03-22 – 2018-03-23 (×3): 325 mg via ORAL
  Filled 2018-03-21 (×3): qty 1

## 2018-03-21 MED ORDER — ONDANSETRON HCL 4 MG PO TABS: 4.0000 mg | ORAL_TABLET | ORAL | Status: DC | PRN

## 2018-03-21 MED ORDER — WITCH HAZEL-GLYCERIN EX PADS: 1.0000 "application " | MEDICATED_PAD | CUTANEOUS | Status: DC | PRN

## 2018-03-21 MED ORDER — HYDROXYZINE HCL 50 MG PO TABS
50.0000 mg | ORAL_TABLET | Freq: Four times a day (QID) | ORAL | Status: DC | PRN
  Filled 2018-03-21: qty 1

## 2018-03-21 MED ORDER — PHENYLEPHRINE 40 MCG/ML (10ML) SYRINGE FOR IV PUSH (FOR BLOOD PRESSURE SUPPORT)
80.0000 ug | PREFILLED_SYRINGE | INTRAVENOUS | Status: DC | PRN
  Filled 2018-03-21 (×2): qty 10

## 2018-03-21 MED ORDER — ONDANSETRON HCL 4 MG/2ML IJ SOLN: 4.0000 mg | Freq: Four times a day (QID) | INTRAMUSCULAR | Status: DC | PRN

## 2018-03-21 MED ORDER — BISACODYL 10 MG RE SUPP: 10.0000 mg | Freq: Every day | RECTAL | Status: DC | PRN

## 2018-03-21 MED ORDER — DIBUCAINE 1 % RE OINT: 1.0000 "application " | TOPICAL_OINTMENT | RECTAL | Status: DC | PRN

## 2018-03-21 MED ORDER — BENZOCAINE-MENTHOL 20-0.5 % EX AERO
1.0000 "application " | INHALATION_SPRAY | CUTANEOUS | Status: DC | PRN
  Administered 2018-03-22: 1 via TOPICAL
  Filled 2018-03-21: qty 56

## 2018-03-21 MED ORDER — SOD CITRATE-CITRIC ACID 500-334 MG/5ML PO SOLN: 30.0000 mL | ORAL | Status: DC | PRN

## 2018-03-21 MED ORDER — DIPHENHYDRAMINE HCL 25 MG PO CAPS: 25.0000 mg | ORAL_CAPSULE | Freq: Four times a day (QID) | ORAL | Status: DC | PRN

## 2018-03-21 MED ORDER — ONDANSETRON HCL 4 MG/2ML IJ SOLN: 4.0000 mg | INTRAMUSCULAR | Status: DC | PRN

## 2018-03-21 MED ORDER — MISOPROSTOL 25 MCG QUARTER TABLET
25.0000 ug | ORAL_TABLET | Freq: Once | ORAL | Status: DC
  Filled 2018-03-21: qty 1

## 2018-03-21 MED ORDER — DIPHENHYDRAMINE HCL 50 MG/ML IJ SOLN: 12.5000 mg | INTRAMUSCULAR | Status: DC | PRN

## 2018-03-21 MED ORDER — IBUPROFEN 600 MG PO TABS
600.0000 mg | ORAL_TABLET | Freq: Four times a day (QID) | ORAL | Status: DC
  Administered 2018-03-22 – 2018-03-23 (×6): 600 mg via ORAL
  Filled 2018-03-21 (×6): qty 1

## 2018-03-21 MED ORDER — METHYLERGONOVINE MALEATE 0.2 MG/ML IJ SOLN: 0.2000 mg | INTRAMUSCULAR | Status: DC | PRN

## 2018-03-21 MED ORDER — LACTATED RINGERS IV SOLN
500.0000 mL | Freq: Once | INTRAVENOUS | Status: AC
  Administered 2018-03-21: 500 mL via INTRAVENOUS

## 2018-03-21 MED ORDER — FENTANYL 2.5 MCG/ML BUPIVACAINE 1/10 % EPIDURAL INFUSION (WH - ANES)
14.0000 mL/h | INTRAMUSCULAR | Status: DC | PRN
  Administered 2018-03-21: 10 mL/h via EPIDURAL
  Filled 2018-03-21: qty 100

## 2018-03-21 MED ORDER — OXYCODONE-ACETAMINOPHEN 5-325 MG PO TABS: 1.0000 | ORAL_TABLET | ORAL | Status: DC | PRN

## 2018-03-21 MED ORDER — SIMETHICONE 80 MG PO CHEW: 80.0000 mg | CHEWABLE_TABLET | ORAL | Status: DC | PRN

## 2018-03-21 MED ORDER — LACTATED RINGERS IV SOLN: 500.0000 mL | INTRAVENOUS | Status: DC | PRN

## 2018-03-21 MED ORDER — ACETAMINOPHEN 325 MG PO TABS: 650.0000 mg | ORAL_TABLET | ORAL | Status: DC | PRN

## 2018-03-21 MED ORDER — LACTATED RINGERS IV SOLN
INTRAVENOUS | Status: DC
  Administered 2018-03-21 (×3): via INTRAVENOUS

## 2018-03-21 NOTE — Anesthesia Pain Management Evaluation Note (Signed)
  CRNA Pain Management Visit Note  Patient: Elizabeth Summers, 23 y.o., female  "Hello I am a member of the anesthesia team at [facility]. We have an anesthesia team available at all times to provide care throughout the hospital, including epidural management and anesthesia for C-section. I don't know your plan for the delivery whether it a natural birth, water birth, IV sedation, nitrous supplementation, doula or epidural, but we want to meet your pain goals."   1.Was your pain managed to your expectations on prior hospitalizations?   No prior hospitalizations  2.What is your expectation for pain management during this hospitalization?     Epidural  3.How can we help you reach that goal? Education provided about epidural anesthesia.  Record the patient's initial score and the patient's pain goal.   Pain: 5  Pain Goal: 8 The [facility] wants you to be able to say your pain was always managed very well.  Elizabeth Summers 03/21/2018

## 2018-03-21 NOTE — Discharge Summary (Addendum)
Postpartum Discharge Summary     Patient Name: Elizabeth Summers DOB: 03-Apr-1995 MRN: [id number]  Date of admission: 03/21/2018 Delivering Provider: Standley BrookingKELLOGG, ALEXANDRA L   Date of discharge: 03/23/2018  Admitting diagnosis: INDUCTION Intrauterine pregnancy: 2962w0d     Secondary diagnosis:  Principal Problem:   Post-dates pregnancy Active Problems:   Previous pregnancy with congenital heart defect, currently pregnant, second trimester  Additional problems: None     Discharge diagnosis: Term Pregnancy Delivered                                                                                                Post partum procedures:None  Augmentation: AROM, Pitocin, Cytotec and Foley Balloon  Complications: None  Hospital course:  Induction of Labor With Vaginal Delivery   23 y.o. yo G3P1011 at 7262w0d was admitted to the hospital 03/21/2018 for induction of labor.  Indication for induction: Postdates.  Patient had an uncomplicated labor course as follows: Membrane Rupture Time/Date: 4:18 PM ,03/21/2018   Intrapartum Procedures: Episiotomy: None [1]                                         Lacerations:  2nd degree [3]  Patient had delivery of a Viable infant.  Information for the patient's newborn:  Ardyth Galamang, Girl Sheral FlowMon [[id number]][[id number]]  Delivery Method: Vaginal, Spontaneous(Filed from Delivery Summary)   03/21/2018  Details of delivery can be found in separate delivery note.  Patient had a routine postpartum course. Patient is discharged home 03/23/18.  Magnesium Sulfate recieved: No BMZ received: No  Physical exam  Vitals:   03/22/18 0845 03/22/18 1220 03/22/18 2251 03/23/18 0548  BP: 104/61 102/62 108/62 107/65  Pulse: 71 83 79 72  Resp: 18 18 18 18   Temp: (!) 97.5 F (36.4 C) 98.4 F (36.9 C) (!) 97.5 F (36.4 C) (!) 97.4 F (36.3 C)  TempSrc: Oral Oral Oral Oral  SpO2: 100% 100%    Weight:      Height:       General: alert, cooperative and no distress Lochia:  appropriate Uterine Fundus: firm Incision: N/A DVT Evaluation: No evidence of DVT seen on physical exam. No significant calf/ankle edema. Labs: Lab Results  Component Value Date   WBC 9.3 03/21/2018   HGB 10.3 (L) 03/21/2018   HCT 32.7 (L) 03/21/2018   MCV 82.2 03/21/2018   PLT 217 03/21/2018   CMP Latest Ref Rng & Units 01/31/2018  Glucose 70 - 99 mg/dL 84  BUN 6 - 20 mg/dL 6  Creatinine 7.250.44 - 3.661.00 mg/dL 4.400.49  Sodium 347135 - 425145 mmol/L 135  Potassium 3.5 - 5.1 mmol/L 4.1  Chloride 98 - 111 mmol/L 110  CO2 22 - 32 mmol/L 17(L)  Calcium 8.9 - 10.3 mg/dL 9.5(G8.7(L)  Total Protein 6.5 - 8.1 g/dL 6.7  Total Bilirubin 0.3 - 1.2 mg/dL 3.8(V0.2(L)  Alkaline Phos 38 - 126 U/L 97  AST 15 - 41 U/L 24  ALT 0 - 44 U/L 18   Discharge instruction:  per After Visit Summary and "Baby and Me Booklet".  After visit meds:  Allergies as of 03/23/2018   No Known Allergies     Medication List    TAKE these medications   ibuprofen 600 MG tablet Commonly known as:  ADVIL,MOTRIN Take 1 tablet (600 mg total) by mouth every 6 (six) hours.   PRENATAL VITAMIN PO Take by mouth.      Diet: routine diet  Activity: Advance as tolerated. Pelvic rest for 6 weeks.   Outpatient follow up:6 weeks Follow up Appt:No future appointments. Follow up Visit:   Please schedule this patient for Postpartum visit in: 6 weeks with the following provider: MD For C/S patients schedule nurse incision check in weeks 2 weeks: no Low risk pregnancy complicated by: None Delivery mode:  SVD Anticipated Birth Control:  Nexplanon PP Procedures needed: None  Schedule Integrated BH visit: no  Newborn Data: Live born female  Birth Weight:   APGAR: 9, 9  Newborn Delivery   Birth date/time:  03/21/2018 21:02:00 Delivery type:  Vaginal, Spontaneous    Baby Feeding: Bottle Disposition:home with mother  03/23/2018 Dollene Cleveland, DO   I spoke with and examined patient and agree with resident/PA-S/MS/SNM's note and  plan of care.  Cheral Marker, CNM, [facility] 03/24/2018 9:18 AM

## 2018-03-21 NOTE — Progress Notes (Signed)
Interpreter on ipad for delivery. Richelle ItoGanga [phone]

## 2018-03-21 NOTE — Progress Notes (Signed)
Epidural consent with dr Jean Rosenthal and napali interpreter at bedside

## 2018-03-21 NOTE — Anesthesia Preprocedure Evaluation (Addendum)
Anesthesia Evaluation  Patient identified by MRN, date of birth, ID band Patient awake    Reviewed: Allergy & Precautions, NPO status , Patient's Chart, lab work & pertinent test results  History of Anesthesia Complications Negative for: history of anesthetic complications  Airway Mallampati: III  TM Distance: >3 FB Neck ROM: Full    Dental  (+) Dental Advisory Given   Pulmonary neg pulmonary ROS,    breath sounds clear to auscultation       Cardiovascular negative cardio ROS   Rhythm:Regular Rate:Normal     Neuro/Psych negative neurological ROS     GI/Hepatic negative GI ROS, Neg liver ROS,   Endo/Other  negative endocrine ROS  Renal/GU negative Renal ROS     Musculoskeletal   Abdominal   Peds  Hematology Hb 10.3, plt 217k   Anesthesia Other Findings   Reproductive/Obstetrics (+) Pregnancy                            Anesthesia Physical Anesthesia Plan  ASA: II  Anesthesia Plan: Epidural   Post-op Pain Management:    Induction:   PONV Risk Score and Plan: 2 and Treatment may vary due to age or medical condition  Airway Management Planned: Natural Airway  Additional Equipment:   Intra-op Plan:   Post-operative Plan:   Informed Consent: I have reviewed the patients History and Physical, chart, labs and discussed the procedure including the risks, benefits and alternatives for the proposed anesthesia with the patient or authorized representative who has indicated his/her understanding and acceptance.     Dental advisory given  Plan Discussed with:   Anesthesia Plan Comments: (Patient identified. Risks/Benefits/Options discussed with patient including but not limited to bleeding, infection, nerve damage, paralysis, failed block, incomplete pain control, headache, blood pressure changes, nausea, vomiting, reactions to medication both or allergic, itching and postpartum back  pain. Confirmed with bedside nurse the patient's most recent platelet count. Confirmed with patient that they are not currently taking any anticoagulation, have any bleeding history or any family history of bleeding disorders. Patient expressed understanding and wished to proceed. All questions were answered.  Nepali translator present)       Anesthesia Quick Evaluation

## 2018-03-21 NOTE — Progress Notes (Signed)
LABOR PROGRESS NOTE  Mariya Tahniya Worton is a 23 y.o. G3P1011 at [redacted]w[redacted]d  admitted for IOL for post dates.  Subjective: Patient is progressing well and pain is well-controlled with epidural. Denies vaginal bleeding or loss of fluids.  Objective: BP 93/62   Pulse 76   Temp 97.8 F (36.6 C) (Oral)   Resp 17   Ht 5\' 1"  (1.549 m)   Wt 68.5 kg   LMP 06/18/2017   SpO2 99%   BMI 28.53 kg/m  or  Vitals:   03/21/18 1431 03/21/18 1509 03/21/18 1531 03/21/18 1601  BP: (!) 94/47 (!) 93/49 (!) 86/43 93/62  Pulse: 83 78 68 76  Resp: 18 17 18 17   Temp:  97.8 F (36.6 C)    TempSrc:  Oral    SpO2:      Weight:      Height:        Dilation: 6 Effacement (%): 70, 80 Cervical Position: Middle Station: -2 Presentation: Vertex Exam by:: Dr Gevena Cotton FHT: baseline rate 135bpm, moderate varibility, 15x15 acel, no decel Toco: ctx q2-3 min  Labs: Lab Results  Component Value Date   WBC 9.3 03/21/2018   HGB 10.3 (L) 03/21/2018   HCT 32.7 (L) 03/21/2018   MCV 82.2 03/21/2018   PLT 217 03/21/2018    Patient Active Problem List   Diagnosis Date Noted  . Post-dates pregnancy 03/21/2018  . Previous pregnancy with congenital heart defect, currently pregnant, second trimester 12/16/2017  . Back pain affecting pregnancy 10/01/2017    Assessment / Plan: 23 y.o. G3P1011 at [redacted]w[redacted]d here for IOL for post dates.  Labor: active, s/p 1x cytotec and FB, AROM at 1620 Fetal Wellbeing:  Cat 1 Pain Control:  Epidural Anticipated MOD:  SVD  Xan Donald Memoli, D.O. 03/21/2018, 4:26 PM

## 2018-03-21 NOTE — Anesthesia Procedure Notes (Signed)
Epidural Patient location during procedure: OB Start time: 03/21/2018 1:00 PM End time: 03/21/2018 1:18 PM  Staffing Anesthesiologist: Jairo Ben, MD Performed: anesthesiologist   Preanesthetic Checklist Completed: patient identified, surgical consent, pre-op evaluation, timeout performed, IV checked, risks and benefits discussed and monitors and equipment checked  Epidural Patient position: sitting Prep: site prepped and draped and DuraPrep Patient monitoring: blood pressure, continuous pulse ox and heart rate Approach: midline Location: L3-L4 Injection technique: LOR air  Needle:  Needle type: Tuohy  Needle gauge: 17 G Needle length: 9 cm Needle insertion depth: 3.5 cm Catheter type: closed end flexible Catheter size: 19 Gauge Catheter at skin depth: 8.5 cm  Assessment Events: blood not aspirated, injection not painful, no injection resistance, negative IV test and no paresthesia  Additional Notes Pt identified in Labor room.  Monitors applied. Working IV access confirmed. Sterile prep, drape lumbar spine.  1% lido local L 3,4.  #17ga Touhy LOR air at 3.5 cm L 3,4, cath in easily to 8.5 cm skin. Test dose OK, cath dosed and infusion begun.  Patient asymptomatic, VSS, no heme aspirated, tolerated well.  Sandford Craze, MDReason for block:procedure for pain

## 2018-03-21 NOTE — Progress Notes (Signed)
LABOR PROGRESS NOTE  Elizabeth Summers is a 23 y.o. G3P1011 at [redacted]w[redacted]d  admitted for IOL for post dates.  Subjective: Patient is comfortable and has been side lying with the peanut. Having minimal vaginal bleeding. Feeling pressure with contractions, pain well-controlled.  Objective: BP 111/69   Pulse 77   Temp 97.9 F (36.6 C) (Oral)   Resp 18   Ht 5\' 1"  (1.549 m)   Wt 68.5 kg   LMP 06/18/2017   SpO2 99%   BMI 28.53 kg/m  or  Vitals:   03/21/18 1731 03/21/18 1801 03/21/18 1831 03/21/18 1839  BP: 110/64 (!) 88/46 111/69   Pulse: 78 79 77   Resp: 18  18   Temp:    97.9 F (36.6 C)  TempSrc:    Oral  SpO2:      Weight:      Height:         Dilation: 7 Effacement (%): 70, 80 Cervical Position: Middle Station: -2 Presentation: Vertex Exam by:: dr Gevena Cotton FHT: baseline rate 130bpm, moderate varibility, 15x15 acel, early decel Toco: ctx q2 min  Labs: Lab Results  Component Value Date   WBC 9.3 03/21/2018   HGB 10.3 (L) 03/21/2018   HCT 32.7 (L) 03/21/2018   MCV 82.2 03/21/2018   PLT 217 03/21/2018    Patient Active Problem List   Diagnosis Date Noted  . Post-dates pregnancy 03/21/2018  . Previous pregnancy with congenital heart defect, currently pregnant, second trimester 12/16/2017  . Back pain affecting pregnancy 10/01/2017    Assessment / Plan: 22 y.o. G3P1011 at [redacted]w[redacted]d here for IOL for post dates.  Labor: active, IUPC placed at 1840, front bag palpated and AROM'd Fetal Wellbeing:  Cat 1 Pain Control:  epidural Anticipated MOD:  SVD  Xan Shriyans Kuenzi, D.O. 03/21/2018, 6:48 PM

## 2018-03-21 NOTE — H&P (Addendum)
LABOR AND DELIVERY ADMISSION HISTORY AND PHYSICAL NOTE  Elizabeth Summers is a 23 y.o. female 53P1011 with IUP at 7224w0d by 15wk US presenting for IOL for post dates. She reports positive fetal movement. She denies leakage of fluid or vaginal bleeding.  [facility]/Complications[facility] Pregnancy complications:  - Fetal Echo done due to sibling with history of heart defect, results normal  Past Medical History: Past Medical History:  Diagnosis Date  . Medical history non-contributory     Past Surgical History: Past Surgical History:  Procedure Laterality Date  . NO PAST SURGERIES      Obstetrical History: OB History    Gravida  3   Para  1   Term  1   Preterm      AB  1   Living  1     SAB  1   TAB      Ectopic      Multiple      Live Births  1           Social History: Social History   Socioeconomic History  . Marital status: Married    Spouse name: Not on file  . Number of children: Not on file  . Years of education: Not on file  . Highest education level: Not on file  Occupational History  . Not on file  Social Needs  . Financial resource strain: Not hard at all  . Food insecurity:    Worry: Never true    Inability: Never true  . Transportation needs:    Medical: No    Non-medical: Not on file  Tobacco Use  . Smoking status: Never Smoker  . Smokeless tobacco: Never Used  Substance and Sexual Activity  . Alcohol use: No  . Drug use: No  . Sexual activity: Not Currently    Birth control/protection: None  Lifestyle  . Physical activity:    Days per week: Not on file    Minutes per session: Not on file  . Stress: Not on file  Relationships  . Social connections:    Talks on phone: Not on file    Gets together: Not on file    Attends religious service: Not on file    Active member of club or organization: Not on file    Attends meetings of clubs or organizations: Not on file    Relationship status: Not on file  Other  Topics Concern  . Not on file  Social History Narrative  . Not on file    Family History: History reviewed. No pertinent family history.  Allergies: No Known Allergies  Medications Prior to Admission  Medication Sig Dispense Refill Last Dose  . acetaminophen (TYLENOL) 325 MG tablet Take 2 tablets (650 mg total) by mouth every 6 (six) hours as needed. (Patient not taking: Reported on 09/22/2017) 30 tablet 0 Not Taking  . Doxylamine-Pyridoxine ER (BONJESTA) 20-20 MG TBCR Take 1 tablet by mouth 2 (two) times daily at 8 am and 10 pm. 60 tablet 0   . Prenatal Vit-Fe Fumarate-FA (PRENATAL VITAMIN PO) Take by mouth.   03/15/2018 at Unknown time     Review of Systems  All systems reviewed and negative except as stated in HPI  Physical Exam Blood pressure 102/65, pulse 84, temperature 98.7 F (37.1 C), temperature source Oral, resp. rate 18, height 5\' 1"  (1.549 m), weight 68.5 kg, last menstrual period 06/18/2017. General appearance: alert, oriented, NAD Lungs: normal respiratory effort Heart: regular rate  Abdomen: soft, non-tender; gravid, FH appropriate for GA Extremities: No calf swelling or tenderness Presentation: cephalic by manual exam Fetal monitoring: 135bpm baseline, moderate variability, 15x15 accels, no decels Uterine activity: occasional Dilation: 2 Effacement (%): 70 Station: -2 Exam by:: stone rnc  Prenatal labs: ABO, Rh: --/--/A POS (02/10 8250) Antibody: NEG (02/10 5397) Rubella: Immune (08/08 0000) RPR: Nonreactive (08/08 0000)  HBsAg: Negative (08/08 0000)  HIV: Non-reactive (08/08 0000)  GC/Chlamydia: neg/neg GBS: Negative (01/09 0000)  1-hr GTT: 86 Genetic screening:  negative Anatomy US: normal, fetal echo normal (done due to sibling hx)  Prenatal Transfer Tool  Maternal Diabetes: No Genetic Screening: Normal Maternal Ultrasounds/Referrals: Normal Fetal Ultrasounds or other Referrals:  None, Fetal echo Maternal Substance Abuse:  No Significant  Maternal Medications:  None Significant Maternal Lab Results: Lab values include: Group B Strep negative  Results for orders placed or performed during the hospital encounter of 03/21/18 (from the past 24 hour(s))  CBC   Collection Time: 03/21/18  8:20 AM  Result Value Ref Range   WBC 9.3 4.0 - 10.5 K/uL   RBC 3.98 3.87 - 5.11 MIL/uL   Hemoglobin 10.3 (L) 12.0 - 15.0 g/dL   HCT 67.3 (L) 41.9 - 37.9 %   MCV 82.2 80.0 - 100.0 fL   MCH 25.9 (L) 26.0 - 34.0 pg   MCHC 31.5 30.0 - 36.0 g/dL   RDW 02.4 09.7 - 35.3 %   Platelets 217 150 - 400 K/uL   nRBC 0.0 0.0 - 0.2 %  Type and screen [facility]   Collection Time: 03/21/18  8:21 AM  Result Value Ref Range   ABO/RH(D) A POS    Antibody Screen NEG    Sample Expiration      03/24/2018 Performed at [facility], [address], [place], [place] [address]     Patient Active Problem List   Diagnosis Date Noted  . Post-dates pregnancy 03/21/2018  . Back pain affecting pregnancy 10/01/2017    Assessment: Elizabeth Summers is a 23 y.o. G3P1011 at [redacted]w[redacted]d here for IOL for post dates  #Labor: Latent, cytotec x1 @0830 , FB@1150  #Pain: undecided #FWB: Cat 1 #ID:  GBS negative #MOF: bottle #MOC: nexplanon #Circ:  N/A  Standley Brooking 03/21/2018, 11:39 AM  Attestation: I have seen this patient and agree with the resident's documentation. I have examined them separately, and we have discussed the plan of care.  Cristal Deer. Earlene Plater, DO OB/GYN Fellow

## 2018-03-22 NOTE — Progress Notes (Signed)
[employer] interpreter [phone] used to admit mom to [facility]. Patient and baby safety reviewed, fall prevention plan discussed. Patient verbalizes understanding and agreement, no questions or concerns. Newman Pies, RN

## 2018-03-22 NOTE — Anesthesia Postprocedure Evaluation (Signed)
Anesthesia Post Note  Patient: Elizabeth Summers  Procedure(s) Performed: AN AD HOC LABOR EPIDURAL     Patient location during evaluation: Mother Baby Anesthesia Type: Epidural Level of consciousness: awake and alert Pain management: pain level controlled Vital Signs Assessment: post-procedure vital signs reviewed and stable Respiratory status: spontaneous breathing, nonlabored ventilation and respiratory function stable Cardiovascular status: stable Postop Assessment: no headache, no backache and epidural receding Anesthetic complications: no    Last Vitals:  Vitals:   03/22/18 0030 03/22/18 0522  BP: 103/67 (!) 95/56  Pulse: 85 69  Resp: 17   Temp: 37.1 C (!) 36.3 C  SpO2:      Last Pain:  Vitals:   03/22/18 0522  TempSrc: Oral  PainSc: 0-No pain   Pain Goal:                   Junious Silk

## 2018-03-22 NOTE — Progress Notes (Addendum)
Post Partum Day 1 Subjective: no complaints, up ad lib, voiding, tolerating PO and + flatus;   Objective: Blood pressure (!) 95/56, pulse 69, temperature (!) 97.4 F (36.3 C), temperature source Oral, resp. rate 17, height 5\' 1"  (1.549 m), weight 68.5 kg, last menstrual period 06/18/2017, SpO2 100 %, unknown if currently breastfeeding.  Physical Exam:  General: alert, cooperative and no distress Lochia: appropriate Uterine Fundus: firm Incision: N/A DVT Evaluation: No evidence of DVT seen on physical exam. No cords or calf tenderness. No significant calf/ankle edema.  Recent Labs    03/21/18 0820  HGB 10.3*  HCT 32.7*    Assessment/Plan: Plan for discharge tomorrow and Social Work consult   LOS: 1 day   Standley Brookinglexandra L Kellogg 03/22/2018, 9:07 AM   CNM attestation Post Partum Day #1 I have seen and examined this patient and agree with above documentation in the resident's note.   Lolamae Colin InaMaya Fore is a 23 y.o. [id number] s/p SVD.  Pt denies problems with ambulating, voiding or po intake. Pain is well controlled.  Plan for birth control is Nexplanon.  Method of Feeding: bottle  PE:  BP 104/61 (BP Location: Right Arm)   Pulse 71   Temp (!) 97.5 F (36.4 C) (Oral)   Resp 18   Ht 5\' 1"  (1.549 m)   Wt 68.5 kg   LMP 06/18/2017   SpO2 100%   Breastfeeding Unknown   BMI 28.53 kg/m  Fundus firm  Plan for discharge: 03/23/18  Arabella MerlesKimberly D Columbia Pandey, CNM 12:00 PM  03/22/2018

## 2018-03-23 MED ORDER — IBUPROFEN 600 MG PO TABS: 600.0000 mg | ORAL_TABLET | Freq: Four times a day (QID) | ORAL | 0 refills | Status: AC

## 2018-09-02 ENCOUNTER — Other Ambulatory Visit: Payer: Self-pay

## 2018-09-02 DIAGNOSIS — Z20822 Contact with and (suspected) exposure to covid-19: Secondary | ICD-10-CM

## 2018-09-06 LAB — NOVEL CORONAVIRUS, NAA: SARS-CoV-2, NAA: NOT DETECTED

## 2020-02-29 ENCOUNTER — Other Ambulatory Visit: Payer: Medicaid Other

## 2020-02-29 DIAGNOSIS — Z20822 Contact with and (suspected) exposure to covid-19: Secondary | ICD-10-CM

## 2020-03-01 LAB — NOVEL CORONAVIRUS, NAA: SARS-CoV-2, NAA: NOT DETECTED

## 2020-03-01 LAB — SARS-COV-2, NAA 2 DAY TAT

## 2021-05-27 DIAGNOSIS — Z01419 Encounter for gynecological examination (general) (routine) without abnormal findings: Secondary | ICD-10-CM | POA: Diagnosis not present

## 2021-05-27 DIAGNOSIS — Z3046 Encounter for surveillance of implantable subdermal contraceptive: Secondary | ICD-10-CM | POA: Diagnosis not present

## 2021-06-18 DIAGNOSIS — Z3046 Encounter for surveillance of implantable subdermal contraceptive: Secondary | ICD-10-CM | POA: Diagnosis not present

## 2021-06-18 DIAGNOSIS — Z30017 Encounter for initial prescription of implantable subdermal contraceptive: Secondary | ICD-10-CM | POA: Diagnosis not present

## 2021-06-18 DIAGNOSIS — Z3202 Encounter for pregnancy test, result negative: Secondary | ICD-10-CM | POA: Diagnosis not present
